# Patient Record
Sex: Female | Born: 1945 | Race: White | Hispanic: No | Marital: Married | Smoking: Never smoker
Health system: Southern US, Community
[De-identification: ages and names within clinical notes are randomized; demographics above are authoritative.]

## PROBLEM LIST (undated history)

## (undated) DIAGNOSIS — K219 Gastro-esophageal reflux disease without esophagitis: Secondary | ICD-10-CM

## (undated) DIAGNOSIS — C3490 Malignant neoplasm of unspecified part of unspecified bronchus or lung: Secondary | ICD-10-CM

## (undated) DIAGNOSIS — E079 Disorder of thyroid, unspecified: Secondary | ICD-10-CM

## (undated) DIAGNOSIS — K5792 Diverticulitis of intestine, part unspecified, without perforation or abscess without bleeding: Secondary | ICD-10-CM

## (undated) DIAGNOSIS — B379 Candidiasis, unspecified: Principal | ICD-10-CM

## (undated) DIAGNOSIS — Z973 Presence of spectacles and contact lenses: Secondary | ICD-10-CM

## (undated) DIAGNOSIS — Z972 Presence of dental prosthetic device (complete) (partial): Secondary | ICD-10-CM

## (undated) DIAGNOSIS — F419 Anxiety disorder, unspecified: Secondary | ICD-10-CM

## (undated) DIAGNOSIS — C50919 Malignant neoplasm of unspecified site of unspecified female breast: Secondary | ICD-10-CM

## (undated) DIAGNOSIS — E785 Hyperlipidemia, unspecified: Secondary | ICD-10-CM

## (undated) DIAGNOSIS — Z803 Family history of malignant neoplasm of breast: Secondary | ICD-10-CM

## (undated) DIAGNOSIS — M1990 Unspecified osteoarthritis, unspecified site: Secondary | ICD-10-CM

## (undated) DIAGNOSIS — J40 Bronchitis, not specified as acute or chronic: Secondary | ICD-10-CM

## (undated) DIAGNOSIS — Z87442 Personal history of urinary calculi: Secondary | ICD-10-CM

## (undated) DIAGNOSIS — Z8042 Family history of malignant neoplasm of prostate: Secondary | ICD-10-CM

## (undated) DIAGNOSIS — K7581 Nonalcoholic steatohepatitis (NASH): Secondary | ICD-10-CM

## (undated) DIAGNOSIS — Z800 Family history of malignant neoplasm of digestive organs: Secondary | ICD-10-CM

## (undated) DIAGNOSIS — I10 Essential (primary) hypertension: Secondary | ICD-10-CM

## (undated) DIAGNOSIS — R7303 Prediabetes: Secondary | ICD-10-CM

## (undated) DIAGNOSIS — R232 Flushing: Secondary | ICD-10-CM

## (undated) DIAGNOSIS — K08109 Complete loss of teeth, unspecified cause, unspecified class: Secondary | ICD-10-CM

## (undated) DIAGNOSIS — M810 Age-related osteoporosis without current pathological fracture: Secondary | ICD-10-CM

## (undated) DIAGNOSIS — N189 Chronic kidney disease, unspecified: Secondary | ICD-10-CM

## (undated) DIAGNOSIS — K5900 Constipation, unspecified: Secondary | ICD-10-CM

## (undated) DIAGNOSIS — H269 Unspecified cataract: Secondary | ICD-10-CM

## (undated) DIAGNOSIS — T7840XA Allergy, unspecified, initial encounter: Secondary | ICD-10-CM

## (undated) HISTORY — PX: OTHER SURGICAL HISTORY: SHX169

## (undated) HISTORY — DX: Bronchitis, not specified as acute or chronic: J40

## (undated) HISTORY — DX: Disorder of thyroid, unspecified: E07.9

## (undated) HISTORY — DX: Candidiasis, unspecified: B37.9

## (undated) HISTORY — DX: Unspecified cataract: H26.9

## (undated) HISTORY — PX: LUNG REMOVAL, PARTIAL: SHX233

## (undated) HISTORY — DX: Malignant neoplasm of unspecified site of unspecified female breast: C50.919

## (undated) HISTORY — PX: COLONOSCOPY: SHX174

## (undated) HISTORY — PX: LITHOTRIPSY: SUR834

## (undated) HISTORY — DX: Chronic kidney disease, unspecified: N18.9

## (undated) HISTORY — PX: BREAST LUMPECTOMY: SHX2

## (undated) HISTORY — DX: Hyperlipidemia, unspecified: E78.5

## (undated) HISTORY — DX: Age-related osteoporosis without current pathological fracture: M81.0

## (undated) HISTORY — DX: Family history of malignant neoplasm of breast: Z80.3

## (undated) HISTORY — DX: Allergy, unspecified, initial encounter: T78.40XA

## (undated) HISTORY — DX: Family history of malignant neoplasm of digestive organs: Z80.0

## (undated) HISTORY — DX: Unspecified osteoarthritis, unspecified site: M19.90

## (undated) HISTORY — DX: Family history of malignant neoplasm of prostate: Z80.42

## (undated) HISTORY — PX: TONSILLECTOMY: SUR1361

## (undated) HISTORY — DX: Flushing: R23.2

---

## 1978-02-10 HISTORY — PX: ABDOMINAL HYSTERECTOMY: SHX81

## 1986-02-10 HISTORY — PX: THYROIDECTOMY, PARTIAL: SHX18

## 1997-05-15 ENCOUNTER — Ambulatory Visit (HOSPITAL_COMMUNITY): Admission: RE | Admit: 1997-05-15 | Discharge: 1997-05-15 | Payer: Self-pay | Admitting: Otolaryngology

## 2001-12-20 ENCOUNTER — Ambulatory Visit (HOSPITAL_BASED_OUTPATIENT_CLINIC_OR_DEPARTMENT_OTHER): Admission: RE | Admit: 2001-12-20 | Discharge: 2001-12-20 | Payer: Self-pay | Admitting: Urology

## 2007-02-11 HISTORY — PX: BREAST SURGERY: SHX581

## 2007-10-22 ENCOUNTER — Encounter: Admission: RE | Admit: 2007-10-22 | Discharge: 2007-10-22 | Payer: Self-pay | Admitting: General Surgery

## 2007-10-25 ENCOUNTER — Encounter: Admission: RE | Admit: 2007-10-25 | Discharge: 2007-10-25 | Payer: Self-pay | Admitting: General Surgery

## 2007-10-25 ENCOUNTER — Encounter (INDEPENDENT_AMBULATORY_CARE_PROVIDER_SITE_OTHER): Payer: Self-pay | Admitting: General Surgery

## 2007-10-25 ENCOUNTER — Ambulatory Visit (HOSPITAL_BASED_OUTPATIENT_CLINIC_OR_DEPARTMENT_OTHER): Admission: RE | Admit: 2007-10-25 | Discharge: 2007-10-25 | Payer: Self-pay | Admitting: General Surgery

## 2007-10-28 ENCOUNTER — Ambulatory Visit: Payer: Self-pay | Admitting: Oncology

## 2007-11-09 ENCOUNTER — Ambulatory Visit: Admission: RE | Admit: 2007-11-09 | Discharge: 2008-01-31 | Payer: Self-pay | Admitting: Radiation Oncology

## 2008-01-11 ENCOUNTER — Ambulatory Visit: Payer: Self-pay | Admitting: Oncology

## 2008-01-13 LAB — CBC WITH DIFFERENTIAL/PLATELET
BASO%: 0.5 % (ref 0.0–2.0)
Eosinophils Absolute: 0 10*3/uL (ref 0.0–0.5)
LYMPH%: 15.1 % (ref 14.0–48.0)
MCH: 32.5 pg (ref 26.0–34.0)
MCHC: 34.9 g/dL (ref 32.0–36.0)
MCV: 93.1 fL (ref 81.0–101.0)
MONO%: 8.9 % (ref 0.0–13.0)
Platelets: 190 10*3/uL (ref 145–400)
RBC: 4.4 10*6/uL (ref 3.70–5.32)

## 2008-01-13 LAB — COMPREHENSIVE METABOLIC PANEL
Alkaline Phosphatase: 67 U/L (ref 39–117)
Glucose, Bld: 88 mg/dL (ref 70–99)
Sodium: 143 mEq/L (ref 135–145)
Total Bilirubin: 0.9 mg/dL (ref 0.3–1.2)
Total Protein: 6.6 g/dL (ref 6.0–8.3)

## 2008-01-14 ENCOUNTER — Ambulatory Visit: Payer: Self-pay | Admitting: Genetic Counselor

## 2008-03-08 ENCOUNTER — Ambulatory Visit: Payer: Self-pay | Admitting: Genetic Counselor

## 2008-03-10 ENCOUNTER — Ambulatory Visit: Payer: Self-pay | Admitting: Oncology

## 2008-03-10 LAB — COMPREHENSIVE METABOLIC PANEL
ALT: 10 U/L (ref 0–35)
AST: 11 U/L (ref 0–37)
Alkaline Phosphatase: 39 U/L (ref 39–117)
Creatinine, Ser: 1.23 mg/dL — ABNORMAL HIGH (ref 0.40–1.20)
Glucose, Bld: 85 mg/dL (ref 70–99)
Sodium: 145 mEq/L (ref 135–145)
Total Bilirubin: 0.4 mg/dL (ref 0.3–1.2)
Total Protein: 6.1 g/dL (ref 6.0–8.3)

## 2008-03-10 LAB — CBC WITH DIFFERENTIAL/PLATELET
BASO%: 0.3 % (ref 0.0–2.0)
HGB: 14.1 g/dL (ref 11.6–15.9)
LYMPH%: 19.8 % (ref 14.0–48.0)
MCHC: 34.8 g/dL (ref 32.0–36.0)
MCV: 93.1 fL (ref 81.0–101.0)
MONO%: 11.2 % (ref 0.0–13.0)
Platelets: 193 10*3/uL (ref 145–400)
RBC: 4.36 10*6/uL (ref 3.70–5.32)
RDW: 12.3 % (ref 11.3–14.5)
WBC: 5.7 10*3/uL (ref 3.9–10.0)

## 2008-05-10 ENCOUNTER — Ambulatory Visit: Payer: Self-pay | Admitting: Oncology

## 2008-05-15 LAB — COMPREHENSIVE METABOLIC PANEL
ALT: 10 U/L (ref 0–35)
AST: 14 U/L (ref 0–37)
Albumin: 3.9 g/dL (ref 3.5–5.2)
Alkaline Phosphatase: 35 U/L — ABNORMAL LOW (ref 39–117)
Potassium: 4.3 mEq/L (ref 3.5–5.3)
Sodium: 140 mEq/L (ref 135–145)
Total Bilirubin: 0.6 mg/dL (ref 0.3–1.2)
Total Protein: 6.2 g/dL (ref 6.0–8.3)

## 2008-05-15 LAB — CBC WITH DIFFERENTIAL/PLATELET
BASO%: 0.2 % (ref 0.0–2.0)
EOS%: 1.1 % (ref 0.0–7.0)
LYMPH%: 20.8 % (ref 14.0–49.7)
MCHC: 34.4 g/dL (ref 31.5–36.0)
MCV: 92.5 fL (ref 79.5–101.0)
MONO%: 9.1 % (ref 0.0–14.0)
NEUT#: 4.1 10*3/uL (ref 1.5–6.5)
Platelets: 169 10*3/uL (ref 145–400)
RBC: 4.36 10*6/uL (ref 3.70–5.45)
RDW: 12.4 % (ref 11.2–14.5)
WBC: 6 10*3/uL (ref 3.9–10.3)

## 2008-08-11 ENCOUNTER — Ambulatory Visit: Payer: Self-pay | Admitting: Oncology

## 2008-08-16 LAB — COMPREHENSIVE METABOLIC PANEL
ALT: 12 U/L (ref 0–35)
Albumin: 3.6 g/dL (ref 3.5–5.2)
Alkaline Phosphatase: 39 U/L (ref 39–117)
CO2: 26 mEq/L (ref 19–32)
Glucose, Bld: 148 mg/dL — ABNORMAL HIGH (ref 70–99)
Potassium: 4.2 mEq/L (ref 3.5–5.3)
Sodium: 143 mEq/L (ref 135–145)
Total Bilirubin: 0.4 mg/dL (ref 0.3–1.2)
Total Protein: 5.6 g/dL — ABNORMAL LOW (ref 6.0–8.3)

## 2008-08-16 LAB — CBC WITH DIFFERENTIAL/PLATELET
BASO%: 0.2 % (ref 0.0–2.0)
Eosinophils Absolute: 0.1 10*3/uL (ref 0.0–0.5)
LYMPH%: 17.3 % (ref 14.0–49.7)
MCHC: 34.2 g/dL (ref 31.5–36.0)
MONO#: 0.4 10*3/uL (ref 0.1–0.9)
NEUT#: 5 10*3/uL (ref 1.5–6.5)
RBC: 4.03 10*6/uL (ref 3.70–5.45)
RDW: 13.2 % (ref 11.2–14.5)
WBC: 6.7 10*3/uL (ref 3.9–10.3)

## 2008-10-06 ENCOUNTER — Encounter: Admission: RE | Admit: 2008-10-06 | Discharge: 2008-10-06 | Payer: Self-pay | Admitting: Oncology

## 2008-11-28 ENCOUNTER — Ambulatory Visit: Payer: Self-pay | Admitting: Oncology

## 2008-12-21 LAB — CBC WITH DIFFERENTIAL/PLATELET
Basophils Absolute: 0 10*3/uL (ref 0.0–0.1)
EOS%: 1.1 % (ref 0.0–7.0)
HGB: 13.7 g/dL (ref 11.6–15.9)
LYMPH%: 23.8 % (ref 14.0–49.7)
MCH: 31.9 pg (ref 25.1–34.0)
MCV: 94.4 fL (ref 79.5–101.0)
MONO%: 8.2 % (ref 0.0–14.0)
Platelets: 170 10*3/uL (ref 145–400)
RBC: 4.31 10*6/uL (ref 3.70–5.45)
RDW: 12.3 % (ref 11.2–14.5)

## 2008-12-21 LAB — COMPREHENSIVE METABOLIC PANEL
AST: 11 U/L (ref 0–37)
Albumin: 4 g/dL (ref 3.5–5.2)
Alkaline Phosphatase: 41 U/L (ref 39–117)
BUN: 18 mg/dL (ref 6–23)
Creatinine, Ser: 0.96 mg/dL (ref 0.40–1.20)
Potassium: 4 mEq/L (ref 3.5–5.3)
Total Bilirubin: 0.6 mg/dL (ref 0.3–1.2)

## 2008-12-25 LAB — PROTEIN ELECTROPHORESIS, SERUM
Albumin ELP: 62.5 % (ref 55.8–66.1)
Alpha-1-Globulin: 5.7 % — ABNORMAL HIGH (ref 2.9–4.9)
Alpha-2-Globulin: 10.1 % (ref 7.1–11.8)
Beta 2: 5 % (ref 3.2–6.5)
Beta Globulin: 7.2 % (ref 4.7–7.2)
Gamma Globulin: 9.5 % — ABNORMAL LOW (ref 11.1–18.8)

## 2008-12-25 LAB — APTT: aPTT: 21 seconds — ABNORMAL LOW (ref 24–37)

## 2008-12-25 LAB — PROTHROMBIN TIME
INR: 0.97 (ref <1.50)
Prothrombin Time: 12.8 seconds (ref 11.6–15.2)

## 2008-12-25 LAB — KAPPA/LAMBDA LIGHT CHAINS

## 2009-03-20 ENCOUNTER — Ambulatory Visit: Payer: Self-pay | Admitting: Oncology

## 2009-03-23 LAB — COMPREHENSIVE METABOLIC PANEL
Albumin: 4 g/dL (ref 3.5–5.2)
Alkaline Phosphatase: 40 U/L (ref 39–117)
BUN: 23 mg/dL (ref 6–23)
Glucose, Bld: 112 mg/dL — ABNORMAL HIGH (ref 70–99)
Potassium: 4.3 mEq/L (ref 3.5–5.3)

## 2009-03-23 LAB — CBC WITH DIFFERENTIAL/PLATELET
Basophils Absolute: 0 10*3/uL (ref 0.0–0.1)
Eosinophils Absolute: 0.1 10*3/uL (ref 0.0–0.5)
HCT: 38.1 % (ref 34.8–46.6)
HGB: 13.1 g/dL (ref 11.6–15.9)
LYMPH%: 26.7 % (ref 14.0–49.7)
MCV: 92.5 fL (ref 79.5–101.0)
MONO%: 9.5 % (ref 0.0–14.0)
NEUT#: 3.2 10*3/uL (ref 1.5–6.5)
NEUT%: 60.7 % (ref 38.4–76.8)
Platelets: 171 10*3/uL (ref 145–400)

## 2009-07-03 ENCOUNTER — Ambulatory Visit: Payer: Self-pay | Admitting: Oncology

## 2009-07-04 ENCOUNTER — Emergency Department (HOSPITAL_COMMUNITY): Admission: EM | Admit: 2009-07-04 | Discharge: 2009-07-04 | Payer: Self-pay | Admitting: Emergency Medicine

## 2009-07-27 LAB — CBC WITH DIFFERENTIAL/PLATELET
Basophils Absolute: 0 10*3/uL (ref 0.0–0.1)
EOS%: 2 % (ref 0.0–7.0)
Eosinophils Absolute: 0.1 10*3/uL (ref 0.0–0.5)
HGB: 13.5 g/dL (ref 11.6–15.9)
LYMPH%: 22.1 % (ref 14.0–49.7)
MCH: 32 pg (ref 25.1–34.0)
MCV: 92.3 fL (ref 79.5–101.0)
MONO%: 7.1 % (ref 0.0–14.0)
NEUT#: 3.6 10*3/uL (ref 1.5–6.5)
Platelets: 156 10*3/uL (ref 145–400)
RBC: 4.22 10*6/uL (ref 3.70–5.45)

## 2009-07-27 LAB — COMPREHENSIVE METABOLIC PANEL
Alkaline Phosphatase: 42 U/L (ref 39–117)
BUN: 19 mg/dL (ref 6–23)
Glucose, Bld: 109 mg/dL — ABNORMAL HIGH (ref 70–99)
Total Bilirubin: 0.4 mg/dL (ref 0.3–1.2)

## 2009-10-24 ENCOUNTER — Ambulatory Visit: Payer: Self-pay | Admitting: Oncology

## 2009-10-29 LAB — CBC WITH DIFFERENTIAL/PLATELET
EOS%: 0 % (ref 0.0–7.0)
Eosinophils Absolute: 0 10*3/uL (ref 0.0–0.5)
LYMPH%: 31.3 % (ref 14.0–49.7)
MCH: 31.6 pg (ref 25.1–34.0)
MCHC: 34.4 g/dL (ref 31.5–36.0)
MCV: 91.7 fL (ref 79.5–101.0)
MONO%: 10.3 % (ref 0.0–14.0)
NEUT#: 3.4 10*3/uL (ref 1.5–6.5)
Platelets: 195 10*3/uL (ref 145–400)
RBC: 3.76 10*6/uL (ref 3.70–5.45)
RDW: 13.1 % (ref 11.2–14.5)

## 2009-10-29 LAB — COMPREHENSIVE METABOLIC PANEL
AST: 12 U/L (ref 0–37)
Alkaline Phosphatase: 42 U/L (ref 39–117)
Glucose, Bld: 83 mg/dL (ref 70–99)
Potassium: 4.2 mEq/L (ref 3.5–5.3)
Sodium: 142 mEq/L (ref 135–145)
Total Bilirubin: 0.3 mg/dL (ref 0.3–1.2)
Total Protein: 5.8 g/dL — ABNORMAL LOW (ref 6.0–8.3)

## 2009-11-16 ENCOUNTER — Encounter: Admission: RE | Admit: 2009-11-16 | Discharge: 2009-11-16 | Payer: Self-pay | Admitting: Oncology

## 2010-02-21 ENCOUNTER — Ambulatory Visit: Payer: Self-pay | Admitting: Oncology

## 2010-02-25 LAB — COMPREHENSIVE METABOLIC PANEL
ALT: 12 U/L (ref 0–35)
AST: 15 U/L (ref 0–37)
Albumin: 3.8 g/dL (ref 3.5–5.2)
Alkaline Phosphatase: 42 U/L (ref 39–117)
BUN: 22 mg/dL (ref 6–23)
CO2: 27 mEq/L (ref 19–32)
Calcium: 8.9 mg/dL (ref 8.4–10.5)
Chloride: 108 mEq/L (ref 96–112)
Creatinine, Ser: 0.98 mg/dL (ref 0.40–1.20)
Glucose, Bld: 88 mg/dL (ref 70–99)
Potassium: 4.3 mEq/L (ref 3.5–5.3)
Sodium: 142 mEq/L (ref 135–145)
Total Bilirubin: 0.4 mg/dL (ref 0.3–1.2)
Total Protein: 6.3 g/dL (ref 6.0–8.3)

## 2010-02-25 LAB — CBC WITH DIFFERENTIAL/PLATELET
BASO%: 0.6 % (ref 0.0–2.0)
Basophils Absolute: 0 10*3/uL (ref 0.0–0.1)
EOS%: 1.2 % (ref 0.0–7.0)
Eosinophils Absolute: 0.1 10*3/uL (ref 0.0–0.5)
HCT: 41.8 % (ref 34.8–46.6)
HGB: 14.3 g/dL (ref 11.6–15.9)
LYMPH%: 28.9 % (ref 14.0–49.7)
MCH: 31.3 pg (ref 25.1–34.0)
MCHC: 34.1 g/dL (ref 31.5–36.0)
MCV: 91.8 fL (ref 79.5–101.0)
MONO#: 0.5 10*3/uL (ref 0.1–0.9)
MONO%: 8.5 % (ref 0.0–14.0)
NEUT#: 3.8 10*3/uL (ref 1.5–6.5)
NEUT%: 60.8 % (ref 38.4–76.8)
Platelets: 186 10*3/uL (ref 145–400)
RBC: 4.56 10*6/uL (ref 3.70–5.45)
RDW: 13.5 % (ref 11.2–14.5)
WBC: 6.2 10*3/uL (ref 3.9–10.3)
lymph#: 1.8 10*3/uL (ref 0.9–3.3)

## 2010-04-29 LAB — URINALYSIS, ROUTINE W REFLEX MICROSCOPIC
Nitrite: NEGATIVE
Specific Gravity, Urine: 1.022 (ref 1.005–1.030)
Urobilinogen, UA: 0.2 mg/dL (ref 0.0–1.0)

## 2010-04-29 LAB — POCT I-STAT, CHEM 8
Calcium, Ion: 1.07 mmol/L — ABNORMAL LOW (ref 1.12–1.32)
Creatinine, Ser: 1.1 mg/dL (ref 0.4–1.2)
Glucose, Bld: 116 mg/dL — ABNORMAL HIGH (ref 70–99)
Hemoglobin: 13.6 g/dL (ref 12.0–15.0)
Sodium: 141 mEq/L (ref 135–145)
TCO2: 24 mmol/L (ref 0–100)

## 2010-04-29 LAB — URINE MICROSCOPIC-ADD ON

## 2010-06-05 ENCOUNTER — Other Ambulatory Visit: Payer: Self-pay | Admitting: Surgery

## 2010-06-05 DIAGNOSIS — M5030 Other cervical disc degeneration, unspecified cervical region: Secondary | ICD-10-CM

## 2010-06-05 DIAGNOSIS — M542 Cervicalgia: Secondary | ICD-10-CM

## 2010-06-05 DIAGNOSIS — M5412 Radiculopathy, cervical region: Secondary | ICD-10-CM

## 2010-06-06 ENCOUNTER — Ambulatory Visit
Admission: RE | Admit: 2010-06-06 | Discharge: 2010-06-06 | Disposition: A | Discharge status: Home / Self Care | Payer: Managed Care, Other (non HMO) | Source: Ambulatory Visit | Attending: Surgery | Admitting: Surgery

## 2010-06-06 DIAGNOSIS — M5412 Radiculopathy, cervical region: Secondary | ICD-10-CM

## 2010-06-06 DIAGNOSIS — M5030 Other cervical disc degeneration, unspecified cervical region: Secondary | ICD-10-CM

## 2010-06-06 DIAGNOSIS — M542 Cervicalgia: Secondary | ICD-10-CM

## 2010-06-07 ENCOUNTER — Other Ambulatory Visit: Payer: Self-pay | Admitting: Surgery

## 2010-06-07 DIAGNOSIS — M5412 Radiculopathy, cervical region: Secondary | ICD-10-CM

## 2010-06-07 DIAGNOSIS — M5030 Other cervical disc degeneration, unspecified cervical region: Secondary | ICD-10-CM

## 2010-06-24 ENCOUNTER — Other Ambulatory Visit: Payer: Self-pay | Admitting: Oncology

## 2010-06-24 ENCOUNTER — Encounter (HOSPITAL_BASED_OUTPATIENT_CLINIC_OR_DEPARTMENT_OTHER): Payer: Managed Care, Other (non HMO) | Admitting: Oncology

## 2010-06-24 DIAGNOSIS — R232 Flushing: Secondary | ICD-10-CM

## 2010-06-24 DIAGNOSIS — Z9889 Other specified postprocedural states: Secondary | ICD-10-CM

## 2010-06-24 DIAGNOSIS — D0590 Unspecified type of carcinoma in situ of unspecified breast: Secondary | ICD-10-CM

## 2010-06-24 LAB — COMPREHENSIVE METABOLIC PANEL
AST: 12 U/L (ref 0–37)
Albumin: 4.1 g/dL (ref 3.5–5.2)
Alkaline Phosphatase: 37 U/L — ABNORMAL LOW (ref 39–117)
Potassium: 4.3 mEq/L (ref 3.5–5.3)
Sodium: 143 mEq/L (ref 135–145)
Total Bilirubin: 0.5 mg/dL (ref 0.3–1.2)
Total Protein: 5.7 g/dL — ABNORMAL LOW (ref 6.0–8.3)

## 2010-06-24 LAB — CBC WITH DIFFERENTIAL/PLATELET
EOS%: 0.4 % (ref 0.0–7.0)
MCH: 32.6 pg (ref 25.1–34.0)
MCHC: 34.9 g/dL (ref 31.5–36.0)
MCV: 93.6 fL (ref 79.5–101.0)
MONO%: 8.8 % (ref 0.0–14.0)
RBC: 4.25 10*6/uL (ref 3.70–5.45)
RDW: 13.1 % (ref 11.2–14.5)

## 2010-06-25 NOTE — Op Note (Signed)
NAMEHAYDAN, Renee Wilkerson                ACCOUNT NO.:  [id number]   MEDICAL RECORD NO.:  [id number]          PATIENT TYPE:  AMB   LOCATION:  DSC                          FACILITY:  [facility]   PHYSICIAN:  Angelia Mould. Derrell Lolling, M.D.DATE OF BIRTH:  09-Aug-1945   DATE OF PROCEDURE:  10/25/2007  DATE OF DISCHARGE:                               OPERATIVE REPORT   PREOPERATIVE DIAGNOSIS:  Ductal carcinoma in situ, right breast.   POSTOPERATIVE DIAGNOSIS:  Ductal carcinoma in situ, right breast.   OPERATION PERFORMED:  Right partial mastectomy with needle localization  and specimen mammogram.   SURGEON:  Angelia Mould. Derrell Lolling, MD   OPERATIVE INDICATIONS:  This is a 65 year old white female who had  screening mammograms recently and calcifications were found.  She had  MRI, ultrasound, and further imaging studies.  What was found was a  solitary area of microcalcifications in the right breast at about the 8  o'clock position.  Core biopsy of this area showed ductal carcinoma in  situ.  She was interested in breast conservation if that was possible.  She was counseled regarding this as an outpatient.  She underwent needle  localization this morning, which showed an area of microcalcifications  that was about 2.5 cm in vertical dimension by about 3 cm wide in 8  o'clock position of the right breast.  She was brought to [facility] for excision of this area.   OPERATIVE TECHNIQUE:  The patient underwent wire localization of her  microcalcifications as described above.  I reviewed the films prior to  the surgery.  She was taken to the operating room for general  anesthesia.  The patient was identified as a correct patient and correct  procedure and correct site.  The right breast was prepped and draped in  sterile fashion.  Marcaine 0.5% with epinephrine was used as a local  infiltration anesthetic.  A radially-oriented incision was made about  the 8 o'clock position on the right breast.   Dissection included a very  thin ellipse of skin to be sure that we got widely around the  microcalcifications.  We then dissected down into the breast tissue with  electrocautery.  With counter traction got widely around this area  trying to get a 1-cm margin in all directions.  The specimen was marked  with silk sutures and also it was marked with the 6 color margin marker  kit.  Specimen mammogram was performed and this was read by the  physicians at the [facility] of [place] and they called back and  said that it looked very good and we appeared to have removed all the  microcalcifications.  The specimens were sent for routine histology.  Hemostasis was excellent and achieved with electrocautery.  The breast  was irrigated with saline.  The breast tissue was closed with  interrupted sutures of 3-0 Vicryl and the skin closed with a  running subcuticular suture of 4-0 Monocryl and Steri-Strips.  Clean  bandages were placed and the patient taken to the recovery room in  stable condition.  Estimated blood loss was  about 10-20 mL.  Complications none.  Sponge, needle, and instrument counts were correct.      Angelia Mould. Derrell Lolling, M.D.  Electronically Signed     HMI/MEDQ  D:  10/25/2007  T:  10/26/2007  Job:  [id number]   cc:   Feliciana Rossetti, MD

## 2010-06-28 ENCOUNTER — Ambulatory Visit
Admission: RE | Admit: 2010-06-28 | Discharge: 2010-06-28 | Disposition: A | Discharge status: Home / Self Care | Payer: Managed Care, Other (non HMO) | Source: Ambulatory Visit | Attending: Surgery | Admitting: Surgery

## 2010-06-28 DIAGNOSIS — M5030 Other cervical disc degeneration, unspecified cervical region: Secondary | ICD-10-CM

## 2010-06-28 DIAGNOSIS — M5412 Radiculopathy, cervical region: Secondary | ICD-10-CM

## 2010-07-03 ENCOUNTER — Encounter (INDEPENDENT_AMBULATORY_CARE_PROVIDER_SITE_OTHER): Payer: Self-pay | Admitting: General Surgery

## 2010-11-13 LAB — COMPREHENSIVE METABOLIC PANEL
ALT: 14
Albumin: 4.1
Alkaline Phosphatase: 55
Chloride: 106
Glucose, Bld: 91
Potassium: 4.6
Sodium: 140
Total Protein: 6.4

## 2010-11-13 LAB — URINALYSIS, ROUTINE W REFLEX MICROSCOPIC
Bilirubin Urine: NEGATIVE
Glucose, UA: NEGATIVE
Hgb urine dipstick: NEGATIVE
Ketones, ur: NEGATIVE
Protein, ur: NEGATIVE

## 2010-11-13 LAB — DIFFERENTIAL
Basophils Relative: 1
Eosinophils Absolute: 0
Monocytes Absolute: 0.5
Monocytes Relative: 10
Neutrophils Relative %: 54

## 2010-11-13 LAB — CBC
HCT: 42.8
MCV: 93.4
Platelets: 226
WBC: 5.4

## 2010-11-13 LAB — POCT HEMOGLOBIN-HEMACUE: Hemoglobin: 13.8

## 2010-11-19 ENCOUNTER — Other Ambulatory Visit: Payer: Self-pay | Admitting: Oncology

## 2010-11-19 DIAGNOSIS — Z853 Personal history of malignant neoplasm of breast: Secondary | ICD-10-CM

## 2010-11-19 DIAGNOSIS — Z9889 Other specified postprocedural states: Secondary | ICD-10-CM

## 2010-11-21 ENCOUNTER — Ambulatory Visit
Admission: RE | Admit: 2010-11-21 | Discharge: 2010-11-21 | Disposition: A | Discharge status: Home / Self Care | Payer: Commercial Indemnity | Source: Ambulatory Visit | Attending: Oncology | Admitting: Oncology

## 2010-11-21 DIAGNOSIS — Z9889 Other specified postprocedural states: Secondary | ICD-10-CM

## 2010-11-25 ENCOUNTER — Other Ambulatory Visit: Payer: Self-pay | Admitting: Oncology

## 2010-11-25 ENCOUNTER — Encounter (HOSPITAL_BASED_OUTPATIENT_CLINIC_OR_DEPARTMENT_OTHER): Payer: Commercial Indemnity | Admitting: Oncology

## 2010-11-25 DIAGNOSIS — Z23 Encounter for immunization: Secondary | ICD-10-CM

## 2010-11-25 DIAGNOSIS — D0590 Unspecified type of carcinoma in situ of unspecified breast: Secondary | ICD-10-CM

## 2010-11-25 LAB — CBC WITH DIFFERENTIAL/PLATELET
BASO%: 0.3 % (ref 0.0–2.0)
Basophils Absolute: 0 10*3/uL (ref 0.0–0.1)
HCT: 38.2 % (ref 34.8–46.6)
HGB: 13.1 g/dL (ref 11.6–15.9)
MCHC: 34.2 g/dL (ref 31.5–36.0)
MONO#: 0.5 10*3/uL (ref 0.1–0.9)
NEUT%: 64.1 % (ref 38.4–76.8)
WBC: 6.5 10*3/uL (ref 3.9–10.3)
lymph#: 1.7 10*3/uL (ref 0.9–3.3)

## 2010-11-25 LAB — COMPREHENSIVE METABOLIC PANEL
ALT: 10 U/L (ref 0–35)
Albumin: 3.9 g/dL (ref 3.5–5.2)
CO2: 25 mEq/L (ref 19–32)
Calcium: 9.1 mg/dL (ref 8.4–10.5)
Chloride: 109 mEq/L (ref 96–112)
Creatinine, Ser: 0.98 mg/dL (ref 0.50–1.10)

## 2010-11-27 ENCOUNTER — Ambulatory Visit
Admission: RE | Admit: 2010-11-27 | Discharge: 2010-11-27 | Disposition: A | Discharge status: Home / Self Care | Payer: Commercial Indemnity | Source: Ambulatory Visit | Attending: Oncology | Admitting: Oncology

## 2010-11-27 DIAGNOSIS — Z853 Personal history of malignant neoplasm of breast: Secondary | ICD-10-CM

## 2010-11-27 DIAGNOSIS — Z9889 Other specified postprocedural states: Secondary | ICD-10-CM

## 2010-11-27 MED ORDER — GADOBENATE DIMEGLUMINE 529 MG/ML IV SOLN
13.0000 mL | Freq: Once | INTRAVENOUS | Status: AC | PRN
  Administered 2010-11-27: 13 mL via INTRAVENOUS

## 2011-01-18 ENCOUNTER — Telehealth: Payer: Self-pay | Admitting: Oncology

## 2011-01-18 NOTE — Telephone Encounter (Signed)
lmonvm advising the pt of her march 2013 appts

## 2011-01-23 ENCOUNTER — Encounter (INDEPENDENT_AMBULATORY_CARE_PROVIDER_SITE_OTHER): Payer: Self-pay | Admitting: General Surgery

## 2011-03-21 ENCOUNTER — Telehealth: Payer: Self-pay | Admitting: Oncology

## 2011-03-21 NOTE — Telephone Encounter (Signed)
per Dr. Ha called pt and r/s appt on 03/11 to 03/27. asked pt to rtn call to confirm appt °

## 2011-03-24 ENCOUNTER — Telehealth: Payer: Self-pay | Admitting: Oncology

## 2011-03-24 NOTE — Telephone Encounter (Signed)
pt rtn call and r/s appt to 03/25

## 2011-04-21 ENCOUNTER — Other Ambulatory Visit: Payer: Commercial Indemnity | Admitting: Lab

## 2011-04-21 ENCOUNTER — Ambulatory Visit: Payer: Commercial Indemnity | Admitting: Oncology

## 2011-05-02 ENCOUNTER — Telehealth: Payer: Self-pay | Admitting: Oncology

## 2011-05-02 NOTE — Telephone Encounter (Signed)
pt called and r/s appt on 03/25 to 04/01

## 2011-05-04 ENCOUNTER — Encounter: Payer: Self-pay | Admitting: Oncology

## 2011-05-04 DIAGNOSIS — R232 Flushing: Secondary | ICD-10-CM | POA: Insufficient documentation

## 2011-05-05 ENCOUNTER — Ambulatory Visit: Payer: Commercial Indemnity | Admitting: Oncology

## 2011-05-05 ENCOUNTER — Other Ambulatory Visit: Payer: Commercial Indemnity | Admitting: Lab

## 2011-05-07 ENCOUNTER — Other Ambulatory Visit: Payer: Commercial Indemnity | Admitting: Lab

## 2011-05-07 ENCOUNTER — Ambulatory Visit: Payer: Commercial Indemnity | Admitting: Oncology

## 2011-05-12 ENCOUNTER — Telehealth: Payer: Self-pay | Admitting: Oncology

## 2011-05-12 ENCOUNTER — Other Ambulatory Visit (HOSPITAL_BASED_OUTPATIENT_CLINIC_OR_DEPARTMENT_OTHER): Payer: Commercial Indemnity | Admitting: Lab

## 2011-05-12 ENCOUNTER — Ambulatory Visit (HOSPITAL_BASED_OUTPATIENT_CLINIC_OR_DEPARTMENT_OTHER): Payer: Commercial Indemnity | Admitting: Oncology

## 2011-05-12 VITALS — BP 150/84 | HR 66 | Temp 98.1°F | Ht 62.0 in | Wt 136.4 lb

## 2011-05-12 DIAGNOSIS — R232 Flushing: Secondary | ICD-10-CM

## 2011-05-12 DIAGNOSIS — C50919 Malignant neoplasm of unspecified site of unspecified female breast: Secondary | ICD-10-CM

## 2011-05-12 DIAGNOSIS — R002 Palpitations: Secondary | ICD-10-CM

## 2011-05-12 DIAGNOSIS — D0590 Unspecified type of carcinoma in situ of unspecified breast: Secondary | ICD-10-CM

## 2011-05-12 DIAGNOSIS — N951 Menopausal and female climacteric states: Secondary | ICD-10-CM

## 2011-05-12 LAB — COMPREHENSIVE METABOLIC PANEL
ALT: 10 U/L (ref 0–35)
AST: 11 U/L (ref 0–37)
Albumin: 4 g/dL (ref 3.5–5.2)
Alkaline Phosphatase: 49 U/L (ref 39–117)
Calcium: 9.3 mg/dL (ref 8.4–10.5)
Chloride: 110 mEq/L (ref 96–112)
Potassium: 4.2 mEq/L (ref 3.5–5.3)
Sodium: 144 mEq/L (ref 135–145)
Total Protein: 5.7 g/dL — ABNORMAL LOW (ref 6.0–8.3)

## 2011-05-12 LAB — CBC WITH DIFFERENTIAL/PLATELET
BASO%: 0.4 % (ref 0.0–2.0)
HCT: 39.7 % (ref 34.8–46.6)
MCHC: 33.4 g/dL (ref 31.5–36.0)
MONO#: 0.6 10*3/uL (ref 0.1–0.9)
NEUT%: 51.4 % (ref 38.4–76.8)
RBC: 4.18 10*6/uL (ref 3.70–5.45)
RDW: 12.6 % (ref 11.2–14.5)
WBC: 5.9 10*3/uL (ref 3.9–10.3)
lymph#: 2.1 10*3/uL (ref 0.9–3.3)
nRBC: 0 % (ref 0–0)

## 2011-05-12 NOTE — Telephone Encounter (Signed)
Gv pt appt for oct2013 °

## 2011-05-12 NOTE — Progress Notes (Signed)
[facility] OFFICE PROGRESS NOTE  DIAGNOSIS:   History of 1.5 cm, grade 3 DCIS.  PAST THERAPY:  Partial mastectomy on October 25, 2007.  Adjuvant radiation therapy finished on January 17, 2008.    CURRENT THERAPY:  She started on adjuvant hormonal therapy, tamoxifen 200 mg p.o. q. day in December 2009.  INTERVAL HISTORY: Renee Wilkerson 66 y.o. female returns for regular follow up with her husband.  She reports feeling relatively well.  She recently developed some chest palpitation and is undergoing Holter's monitoring with Dr. Belva Crome, a cardiologist in [place]. She still has hot flash and night sweat.  She is taking Tamoxifen without other side effects.  She denies breast abnormality.  She is adherent to self breast exam.  She denies bone/muscle pain.  She denies leg/calf swelling or pain.  She has not had vaginal bleeding or discharge.  She is working almost full time at a [employer].  She does have mild fatigue coming home from work but she is independent of all activities of daily living.   Patient denies headache, visual changes, confusion, drenching night sweats, palpable lymph node swelling, mucositis, odynophagia, dysphagia, nausea vomiting, jaundice, chest pain, shortness of breath, dyspnea on exertion, productive cough, gum bleeding, epistaxis, hematemesis, hemoptysis, abdominal pain, abdominal swelling, early satiety, melena, hematochezia, hematuria, skin rash, spontaneous bleeding, joint swelling, joint pain, heat or cold intolerance, bowel bladder incontinence, back pain, focal motor weakness, paresthesia, depression, suicidal or homocidal ideation, feeling hopelessness.   Past Medical History  Diagnosis Date  . Breast cancer   . Thyroid disease   . Arthritis   . Hot flashes     Past Surgical History  Procedure Date  . Breast surgery 2009    BREAST    Current Outpatient Prescriptions  Medication Sig Dispense Refill  . omeprazole  (PRILOSEC) 20 MG capsule Take 20 mg by mouth daily.      Marland Kitchen TAMOXIFEN CITRATE PO Take by mouth.        . Zolpidem Tartrate (AMBIEN PO) Take by mouth.          ALLERGIES:  is allergic to sulfa antibiotics.  REVIEW OF SYSTEMS:  The rest of the 14-point review of system was negative.   Filed Vitals:   05/12/11 1502  BP: 150/84  Pulse: 66  Temp: 98.1 F (36.7 C)   Wt Readings from Last 3 Encounters:  05/12/11 136 lb 6.4 oz (61.871 kg)   ECOG Performance status: 0  PHYSICAL EXAMINATION:   General:  well-nourished in no acute distress.  Eyes:  no scleral icterus.  ENT:  There were no oropharyngeal lesions.  Neck was without thyromegaly.  Lymphatics:  Negative cervical, supraclavicular or axillary adenopathy.  Respiratory: lungs were clear bilaterally without wheezing or crackles.  Cardiovascular:  Regular rate and rhythm, S1/S2, without murmur, rub or gallop.  There was no pedal edema.  GI:  abdomen was soft, flat, nontender, nondistended, without organomegaly.  Muscoloskeletal:  no spinal tenderness of palpation of vertebral spine.  Skin exam was without echymosis, petichae.  Neuro exam was nonfocal.  Patient was able to get on and off exam table without assistance.  Gait was normal.  Patient was alerted and oriented.  Attention was good.   Language was appropriate.  Mood was normal without depression.  Speech was not pressured.  Thought content was not tangential.  Bilateral breast exam was negative for abnormal mass, skin thickening, erythema, or pain on palpation.  LABORATORY/RADIOLOGY DATA:  Lab Results  Component Value Date   WBC 5.9 05/12/2011   HGB 13.3 05/12/2011   HCT 39.7 05/12/2011   PLT 171 05/12/2011   GLUCOSE 86 05/12/2011   ALKPHOS 49 05/12/2011   ALT 10 05/12/2011   AST 11 05/12/2011   NA 144 05/12/2011   K 4.2 05/12/2011   CL 110 05/12/2011   CREATININE 0.90 05/12/2011   BUN 18 05/12/2011   CO2 26 05/12/2011   INR 0.97 12/21/2008     ASSESSMENT AND PLAN:   1. History of  DCIS.  No evidence of recurrence or metastatic disease.  Her last breast MRI in Oct 2012 was negative. Her next routine diagnostic breast mammogram is due in Oct 2013.   She has no major side effects of Tamoxifen except for hot flash.  I advised her to continue with tamoxifen until December 2014.  She asked me to refill her Tamoxifen today.  2. Hot flashes and night sweats.  She does not want meds to suppress this symptom.  3. Palpitation:  On Holter's per Dr. Tomie China.  I discussed with patient and her husband that cardiac arrythmia is not a common side effect of Tamoxifen.  4. Age-appropriate cancer screening:  Her colonoscopy was done by Dr. Chales Abrahams from [place] which was negative in 2012 and she said the next time it is due is 2017.  She thinks that her Pap smear is up to date.     The length of time of the face-to-face encounter was 15 minutes. More than 50% of time was spent counseling and coordination of care.

## 2011-10-08 ENCOUNTER — Other Ambulatory Visit: Payer: Self-pay | Admitting: Oncology

## 2011-11-11 ENCOUNTER — Ambulatory Visit (HOSPITAL_BASED_OUTPATIENT_CLINIC_OR_DEPARTMENT_OTHER): Payer: Commercial Indemnity | Admitting: Oncology

## 2011-11-11 ENCOUNTER — Other Ambulatory Visit (HOSPITAL_BASED_OUTPATIENT_CLINIC_OR_DEPARTMENT_OTHER): Payer: Commercial Indemnity | Admitting: Lab

## 2011-11-11 ENCOUNTER — Encounter: Payer: Self-pay | Admitting: Oncology

## 2011-11-11 ENCOUNTER — Telehealth: Payer: Self-pay | Admitting: Oncology

## 2011-11-11 VITALS — BP 145/78 | HR 72 | Temp 98.0°F | Resp 20 | Ht 62.0 in | Wt 132.5 lb

## 2011-11-11 DIAGNOSIS — D0590 Unspecified type of carcinoma in situ of unspecified breast: Secondary | ICD-10-CM

## 2011-11-11 DIAGNOSIS — C50919 Malignant neoplasm of unspecified site of unspecified female breast: Secondary | ICD-10-CM

## 2011-11-11 DIAGNOSIS — D0510 Intraductal carcinoma in situ of unspecified breast: Secondary | ICD-10-CM | POA: Insufficient documentation

## 2011-11-11 DIAGNOSIS — Z23 Encounter for immunization: Secondary | ICD-10-CM

## 2011-11-11 DIAGNOSIS — R232 Flushing: Secondary | ICD-10-CM

## 2011-11-11 LAB — CBC WITH DIFFERENTIAL/PLATELET
BASO%: 0.3 % (ref 0.0–2.0)
EOS%: 0.9 % (ref 0.0–7.0)
MCH: 31.3 pg (ref 25.1–34.0)
MCHC: 33.3 g/dL (ref 31.5–36.0)
MCV: 93.8 fL (ref 79.5–101.0)
MONO%: 7 % (ref 0.0–14.0)
NEUT%: 60.1 % (ref 38.4–76.8)
RDW: 12.5 % (ref 11.2–14.5)
lymph#: 2 10*3/uL (ref 0.9–3.3)

## 2011-11-11 MED ORDER — INFLUENZA VIRUS VACC SPLIT PF IM SUSP
0.5000 mL | Freq: Once | INTRAMUSCULAR | Status: AC
  Administered 2011-11-11: 0.5 mL via INTRAMUSCULAR
  Filled 2011-11-11: qty 0.5

## 2011-11-11 MED ORDER — TAMOXIFEN CITRATE 20 MG PO TABS: 20.0000 mg | ORAL_TABLET | Freq: Every day | ORAL | Status: DC

## 2011-11-11 NOTE — Telephone Encounter (Signed)
gv and printed appt for pt. °

## 2011-11-11 NOTE — Progress Notes (Signed)
[facility] OFFICE PROGRESS NOTE  DIAGNOSIS:   History of 1.5 cm, grade 3 DCIS.  PAST THERAPY:  Partial mastectomy on October 25, 2007.  Adjuvant radiation therapy finished on January 17, 2008.    CURRENT THERAPY:  She started on adjuvant hormonal therapy, tamoxifen 200 mg p.o. q. day in December 2009.  INTERVAL HISTORY: Renee Wilkerson 66 y.o. female returns for regular follow up with her husband.  She reports feeling relatively well.  She still has hot flashes and night sweats.  She is taking Tamoxifen without other side effects.  She denies breast abnormality.  She is adherent to self breast exam.  She denies bone/muscle pain.  She denies leg/calf swelling or pain.  She has not had vaginal bleeding or discharge.  She is working almost full time at a [employer].  She does have mild fatigue coming home from work but she is independent of all activities of daily living.   Patient denies headache, visual changes, confusion, drenching night sweats, palpable lymph node swelling, mucositis, odynophagia, dysphagia, nausea vomiting, jaundice, chest pain, shortness of breath, dyspnea on exertion, productive cough, gum bleeding, epistaxis, hematemesis, hemoptysis, abdominal pain, abdominal swelling, early satiety, melena, hematochezia, hematuria, skin rash, spontaneous bleeding, joint swelling, joint pain, heat or cold intolerance, bowel bladder incontinence, back pain, focal motor weakness, paresthesia, depression, suicidal or homocidal ideation, feeling hopelessness.   Past Medical History  Diagnosis Date  . Breast cancer   . Thyroid disease   . Arthritis   . Hot flashes     Past Surgical History  Procedure Date  . Breast surgery 2009    BREAST    Current Outpatient Prescriptions  Medication Sig Dispense Refill  . omeprazole (PRILOSEC) 20 MG capsule Take 20 mg by mouth daily.      . tamoxifen (NOLVADEX) 20 MG tablet Take 1 tablet (20 mg total) by mouth daily.  90  tablet  2  . TAMOXIFEN CITRATE PO Take by mouth.        . Zolpidem Tartrate (AMBIEN PO) Take by mouth.         Current Facility-Administered Medications  Medication Dose Route Frequency Provider Last Rate Last Dose  . influenza  inactive virus vaccine (FLUZONE/FLUARIX) injection 0.5 mL  0.5 mL Intramuscular Once Myrtis Ser, NP   0.5 mL at 11/11/11 1604    ALLERGIES:  is allergic to sulfa antibiotics.  REVIEW OF SYSTEMS:  The rest of the 14-point review of system was negative.   Filed Vitals:   11/11/11 1521  BP: 145/78  Pulse: 72  Temp: 98 F (36.7 C)  Resp: 20   Wt Readings from Last 3 Encounters:  11/11/11 132 lb 8 oz (60.102 kg)  05/12/11 136 lb 6.4 oz (61.871 kg)   ECOG Performance status: 0  PHYSICAL EXAMINATION:   General:  well-nourished in no acute distress.  Eyes:  no scleral icterus.  ENT:  There were no oropharyngeal lesions.  Neck was without thyromegaly.  Lymphatics:  Negative cervical, supraclavicular or axillary adenopathy.  Respiratory: lungs were clear bilaterally without wheezing or crackles.  Cardiovascular:  Regular rate and rhythm, S1/S2, without murmur, rub or gallop.  There was no pedal edema.  GI:  abdomen was soft, flat, nontender, nondistended, without organomegaly.  Muscoloskeletal:  no spinal tenderness of palpation of vertebral spine.  Skin exam was without echymosis, petichae.  Neuro exam was nonfocal.  Patient was able to get on and off exam table without assistance.  Gait  was normal.  Patient was alerted and oriented.  Attention was good.   Language was appropriate.  Mood was normal without depression.  Speech was not pressured.  Thought content was not tangential.  Bilateral breast exam was negative for abnormal mass, skin thickening, erythema, or pain on palpation.    LABORATORY/RADIOLOGY DATA:  Lab Results  Component Value Date   WBC 6.4 11/11/2011   HGB 13.2 11/11/2011   HCT 39.6 11/11/2011   PLT 153 11/11/2011   GLUCOSE 86 05/12/2011    ALKPHOS 49 05/12/2011   ALT 10 05/12/2011   AST 11 05/12/2011   NA 144 05/12/2011   K 4.2 05/12/2011   CL 110 05/12/2011   CREATININE 0.90 05/12/2011   BUN 18 05/12/2011   CO2 26 05/12/2011   INR 0.97 12/21/2008     ASSESSMENT AND PLAN:   1. History of DCIS.  No evidence of recurrence or metastatic disease. Her next routine diagnostic breast mammogram is due in Oct 2013 which I have scheduled today.   She has no major side effects of Tamoxifen except for hot flash.  I advised her to continue with tamoxifen until December 2014.  I have refilled her Tamoxifen today.  2. Hot flashes and night sweats.  She does not want meds to suppress this symptom.  3. Palpitation: She has had an extensive cardiac work-up in the past and no cause was identified. I discussed with patient and her husband that cardiac arrythmia is not a common side effect of Tamoxifen.  4. Age-appropriate cancer screening:  Her colonoscopy was done by Dr. Chales Abrahams from [place] which was negative in 2012 and she said the next time it is due is 2017.  She states her Pap smear is up to date. Her flu shot was given in the office today. She inquired about the shingles vaccine and I explained that there is no contraindication to receiving this vaccine from our standpoint. She will follow-up with her PCP to obtain this vaccine. 5. Follow-up: 6 months.    The length of time of the face-to-face encounter was 15 minutes. More than 50% of time was spent counseling and coordination of care.

## 2011-11-11 NOTE — Patient Instructions (Addendum)
Schedule your mammogram for October 2013.  We will see you back in 6 months for a routine visit. Continue Tamoxifen.

## 2011-11-27 ENCOUNTER — Ambulatory Visit
Admission: RE | Admit: 2011-11-27 | Discharge: 2011-11-27 | Disposition: A | Discharge status: Home / Self Care | Payer: Commercial Indemnity | Source: Ambulatory Visit | Attending: Oncology | Admitting: Oncology

## 2011-11-27 DIAGNOSIS — D0510 Intraductal carcinoma in situ of unspecified breast: Secondary | ICD-10-CM

## 2011-11-28 ENCOUNTER — Telehealth: Payer: Self-pay

## 2011-11-28 ENCOUNTER — Other Ambulatory Visit: Payer: Self-pay

## 2011-11-28 NOTE — Telephone Encounter (Signed)
Message copied by Kallie Locks on Fri Nov 28, 2011 11:11 AM ------      Message from: Clenton Pare R      Created: Thu Nov 27, 2011  7:43 PM       Please call patient and let her know her mammogram did not show any evidence of cancer. F/U mammogram in 1 year.

## 2012-05-12 ENCOUNTER — Other Ambulatory Visit: Payer: Self-pay | Admitting: Oncology

## 2012-05-12 ENCOUNTER — Ambulatory Visit (HOSPITAL_BASED_OUTPATIENT_CLINIC_OR_DEPARTMENT_OTHER): Payer: Medicare Other | Admitting: Oncology

## 2012-05-12 ENCOUNTER — Other Ambulatory Visit (HOSPITAL_BASED_OUTPATIENT_CLINIC_OR_DEPARTMENT_OTHER): Payer: Medicare Other | Admitting: Lab

## 2012-05-12 ENCOUNTER — Telehealth: Payer: Self-pay | Admitting: Oncology

## 2012-05-12 VITALS — BP 170/88 | HR 68 | Temp 97.6°F | Resp 18 | Ht 62.0 in | Wt 134.2 lb

## 2012-05-12 DIAGNOSIS — D0590 Unspecified type of carcinoma in situ of unspecified breast: Secondary | ICD-10-CM

## 2012-05-12 DIAGNOSIS — D0511 Intraductal carcinoma in situ of right breast: Secondary | ICD-10-CM

## 2012-05-12 DIAGNOSIS — D0510 Intraductal carcinoma in situ of unspecified breast: Secondary | ICD-10-CM

## 2012-05-12 LAB — CBC WITH DIFFERENTIAL/PLATELET
BASO%: 1 % (ref 0.0–2.0)
Eosinophils Absolute: 0.1 10*3/uL (ref 0.0–0.5)
LYMPH%: 46.2 % (ref 14.0–49.7)
MCHC: 33.1 g/dL (ref 31.5–36.0)
MCV: 93.3 fL (ref 79.5–101.0)
MONO%: 10.6 % (ref 0.0–14.0)
NEUT#: 2 10*3/uL (ref 1.5–6.5)
Platelets: 157 10*3/uL (ref 145–400)
RBC: 4.11 10*6/uL (ref 3.70–5.45)
RDW: 13.3 % (ref 11.2–14.5)
WBC: 5 10*3/uL (ref 3.9–10.3)

## 2012-05-12 LAB — COMPREHENSIVE METABOLIC PANEL (CC13)
ALT: 7 U/L (ref 0–55)
AST: 12 U/L (ref 5–34)
Albumin: 3.4 g/dL — ABNORMAL LOW (ref 3.5–5.0)
Alkaline Phosphatase: 55 U/L (ref 40–150)
Glucose: 83 mg/dl (ref 70–99)
Potassium: 4 mEq/L (ref 3.5–5.1)
Sodium: 143 mEq/L (ref 136–145)
Total Bilirubin: 0.33 mg/dL (ref 0.20–1.20)
Total Protein: 5.9 g/dL — ABNORMAL LOW (ref 6.4–8.3)

## 2012-05-12 NOTE — Telephone Encounter (Signed)
, °

## 2012-05-12 NOTE — Progress Notes (Signed)
[facility] OFFICE PROGRESS NOTE  DIAGNOSIS:   History of 1.5 cm, grade 3 DCIS.  PAST THERAPY:  Partial mastectomy on October 25, 2007.  Adjuvant radiation therapy finished on January 17, 2008.    CURRENT THERAPY:  She started on adjuvant hormonal therapy, tamoxifen 200 mg p.o. q. day in December 2009.  INTERVAL HISTORY: Renee Wilkerson 67 y.o. female returns for regular follow up with her husband.  She developed right breast discomfort about 2 weeks ago.  It appeared red and inflamed.  There was no trauma, opened wound.  It was near the site of right lumpectomy.  It was tender to the point of her not being able to touch it.  It now is not as tender; however, she can still feel some thickening in the area.  She still has mild hot flash.  She is getting used to it.  She denied fever, headache, SOB, chest pain, abdominal pain, bleeding symptoms; calf/thigh swelling/pain, vaginal bleeding.  She is working part time at a [employer].  It is going well for her. She reports bilateral hip pain, worst in the morning; but also worsened being on her feet working.  Pain is mild to moderate; radiating to bilateral lateral thigh; feeling like shooting pain sometime.  She denied leg weakness, paresthesia.  The rest of the 14-point review of system was negative.    Past Medical History  Diagnosis Date  . Breast cancer   . Thyroid disease   . Arthritis   . Hot flashes     Past Surgical History  Procedure Laterality Date  . Breast surgery  2009    BREAST    Current Outpatient Prescriptions  Medication Sig Dispense Refill  . omeprazole (PRILOSEC) 20 MG capsule Take 20 mg by mouth daily.      . tamoxifen (NOLVADEX) 20 MG tablet Take 1 tablet (20 mg total) by mouth daily.  90 tablet  2  . TAMOXIFEN CITRATE PO Take by mouth.        . Zolpidem Tartrate (AMBIEN PO) Take by mouth.         No current facility-administered medications for this visit.    ALLERGIES:  is allergic to  sulfa antibiotics.  REVIEW OF SYSTEMS:  The rest of the 14-point review of system was negative.   Filed Vitals:   05/12/12 1428  BP: 170/88  Pulse: 68  Temp: 97.6 F (36.4 C)  Resp: 18   Wt Readings from Last 3 Encounters:  05/12/12 134 lb 3.2 oz (60.873 kg)  11/11/11 132 lb 8 oz (60.102 kg)  05/12/11 136 lb 6.4 oz (61.871 kg)   ECOG Performance status: 0  PHYSICAL EXAMINATION:   General:  well-nourished in no acute distress.  Eyes:  no scleral icterus.  ENT:  There were no oropharyngeal lesions.  Neck was without thyromegaly.  Lymphatics:  Negative cervical, supraclavicular or axillary adenopathy.  Respiratory: lungs were clear bilaterally without wheezing or crackles.  Cardiovascular:  Regular rate and rhythm, S1/S2, without murmur, rub or gallop.  There was no pedal edema.  GI:  abdomen was soft, flat, nontender, nondistended, without organomegaly.  Muscoloskeletal:  no spinal tenderness of palpation of vertebral spine.  Skin exam was without echymosis, petichae.  Neuro exam was nonfocal.  Patient was able to get on and off exam table without assistance.  Gait was normal.  Patient was alerted and oriented.  Attention was good.   Language was appropriate.  Mood was normal without depression.  Speech was not pressured.  Thought content was not tangential.  Bilateral breast exam showed left outer lower quadrant lumpectomy scar, with mild tenderness to palpation. There was no erythema, purulent discharge, skin thickening.         LABORATORY/RADIOLOGY DATA:  Lab Results  Component Value Date   WBC 5.0 05/12/2012   HGB 12.7 05/12/2012   HCT 38.4 05/12/2012   PLT 157 05/12/2012   GLUCOSE 83 05/12/2012   ALKPHOS 55 05/12/2012   ALT 7 05/12/2012   AST 12 05/12/2012   NA 143 05/12/2012   K 4.0 05/12/2012   CL 111* 05/12/2012   CREATININE 0.8 05/12/2012   BUN 14.4 05/12/2012   CO2 26 05/12/2012   INR 0.97 12/21/2008     ASSESSMENT AND PLAN:   1. History of DCIS.  I referred her to have right breast  mammogram given her symptoms.  I advised her to continue Tamoxifen until November 2014 when she will have completed 5 years of adjuvant Tamoxifen.  There is no strong indication to prolong it to more than 5 years.  There is data for longer duration of Tamoxifen in invasive breast cancer but not DCIS.  Given her hot flash, she would like to stop at that time as well.  2. Hot flashes and night sweats. Stable. No med needed.  3. Palpitation:  Negative work up with cardiology.  It has resolved.  4. Age-appropriate cancer screening:  Her colonoscopy was done by Dr. Chales Abrahams from [place] which was negative in 2012 and she said the next time it is due is 2017.  She thinks that her Pap smear is up to date.  5. Bilateral hip pain:  Most likely DJD, OA.  I advised her to see her PCP for advice.  If pain significantly worsens, we may consider bone scan.  However, with only DCIS, the chance of bone met is very low.  6. Follow up:  In about 6 months with mid level but sooner if mammogram is abnormal.   I said goodbye to [name].  I am leaving the practice in a few months, and the practice will arrange for her to find another oncologist her in the practice. It has been an honor taking care of [name] these past 5 years.     The length of time of the face-to-face encounter was 15 minutes. More than 50% of time was spent counseling and coordination of care.

## 2012-05-24 ENCOUNTER — Other Ambulatory Visit: Payer: Self-pay | Admitting: Oncology

## 2012-05-24 ENCOUNTER — Ambulatory Visit
Admission: RE | Admit: 2012-05-24 | Discharge: 2012-05-24 | Disposition: A | Discharge status: Home / Self Care | Payer: Medicare Other | Source: Ambulatory Visit | Attending: Oncology | Admitting: Oncology

## 2012-05-24 DIAGNOSIS — D0511 Intraductal carcinoma in situ of right breast: Secondary | ICD-10-CM

## 2012-05-24 DIAGNOSIS — N644 Mastodynia: Secondary | ICD-10-CM

## 2012-10-19 ENCOUNTER — Other Ambulatory Visit: Payer: Self-pay | Admitting: Internal Medicine

## 2012-10-19 ENCOUNTER — Other Ambulatory Visit: Payer: Self-pay | Admitting: Lab

## 2012-10-19 DIAGNOSIS — Z853 Personal history of malignant neoplasm of breast: Secondary | ICD-10-CM

## 2012-11-15 ENCOUNTER — Other Ambulatory Visit: Payer: Self-pay | Admitting: Oncology

## 2012-11-22 ENCOUNTER — Other Ambulatory Visit: Payer: Self-pay | Admitting: *Deleted

## 2012-11-22 DIAGNOSIS — D0511 Intraductal carcinoma in situ of right breast: Secondary | ICD-10-CM

## 2012-11-22 MED ORDER — TAMOXIFEN CITRATE 20 MG PO TABS: 20.0000 mg | ORAL_TABLET | Freq: Every day | ORAL | Status: DC

## 2012-11-25 ENCOUNTER — Other Ambulatory Visit: Payer: Self-pay | Admitting: Oncology

## 2012-11-25 DIAGNOSIS — Z853 Personal history of malignant neoplasm of breast: Secondary | ICD-10-CM

## 2012-11-26 ENCOUNTER — Telehealth: Payer: Self-pay | Admitting: Hematology and Oncology

## 2012-11-26 NOTE — Telephone Encounter (Signed)
pt switched to Dr. Bertis Ruddy per MD request

## 2012-11-29 ENCOUNTER — Ambulatory Visit
Admission: RE | Admit: 2012-11-29 | Discharge: 2012-11-29 | Disposition: A | Discharge status: Home / Self Care | Payer: Medicare Other | Source: Ambulatory Visit | Attending: Internal Medicine | Admitting: Internal Medicine

## 2012-11-29 DIAGNOSIS — Z853 Personal history of malignant neoplasm of breast: Secondary | ICD-10-CM

## 2012-12-15 ENCOUNTER — Other Ambulatory Visit: Payer: Medicare Other | Admitting: Lab

## 2012-12-15 ENCOUNTER — Ambulatory Visit: Payer: Medicare Other | Admitting: Oncology

## 2012-12-15 ENCOUNTER — Ambulatory Visit (HOSPITAL_BASED_OUTPATIENT_CLINIC_OR_DEPARTMENT_OTHER): Payer: Medicare Other | Admitting: Hematology and Oncology

## 2012-12-15 ENCOUNTER — Encounter: Payer: Self-pay | Admitting: Hematology and Oncology

## 2012-12-15 VITALS — BP 147/85 | HR 65 | Temp 97.0°F | Resp 18 | Ht 62.0 in | Wt 130.4 lb

## 2012-12-15 DIAGNOSIS — N951 Menopausal and female climacteric states: Secondary | ICD-10-CM

## 2012-12-15 DIAGNOSIS — R232 Flushing: Secondary | ICD-10-CM

## 2012-12-15 DIAGNOSIS — D0590 Unspecified type of carcinoma in situ of unspecified breast: Secondary | ICD-10-CM

## 2012-12-15 DIAGNOSIS — D0511 Intraductal carcinoma in situ of right breast: Secondary | ICD-10-CM

## 2012-12-15 NOTE — Progress Notes (Signed)
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 She received adjuvant radiation therapy until 01/17/2008 and was placed on tamoxifen since then.  INTERVAL HISTORY: Renee Wilkerson 67 y.o. female returns for further followup. She complained of right breast discomfort along the surgical site. Denies any palpable lumps. With tamoxifen, she has significant hot flashes. Apparently multiple agents were tried but he was not helpful. She also complained of chronic skeletal bone pain and takes pain medication as needed. Denies any mood swings with depression.  I have reviewed the past medical history, past surgical history, social history and family history with the patient and they are unchanged from previous note.  ALLERGIES:  is allergic to sulfa antibiotics.  MEDICATIONS:  Current Outpatient Prescriptions  Medication Sig Dispense Refill  . HYDROcodone-acetaminophen (NORCO/VICODIN) 5-325 MG per tablet Take 1 tablet by mouth every 6 (six) hours as needed for moderate pain.      Marland Kitchen omeprazole (PRILOSEC) 20 MG capsule Take 20 mg by mouth daily.      . tamoxifen (NOLVADEX) 20 MG tablet Take 1 tablet (20 mg total) by mouth daily.  30 tablet  0  . Zolpidem Tartrate (AMBIEN PO) Take by mouth.         No current facility-administered medications for this visit.    REVIEW OF SYSTEMS:   Constitutional: Denies fevers, chills or abnormal weight loss Eyes: Denies blurriness of vision Ears, nose, mouth, throat, and face: Denies mucositis or sore throat Respiratory: Denies cough, dyspnea or wheezes Cardiovascular: Denies palpitation, chest discomfort or lower  extremity swelling Gastrointestinal:  Denies nausea, heartburn or change in bowel habits Skin: Denies abnormal skin rashes Lymphatics: Denies new lymphadenopathy or easy bruising Neurological:Denies numbness, tingling or new weaknesses Behavioral/Psych: Mood is stable, no new changes  All other systems were reviewed with the patient and are negative.  PHYSICAL EXAMINATION: ECOG PERFORMANCE STATUS: 0 - Asymptomatic  Filed Vitals:   12/15/12 1500  BP: 147/85  Pulse: 65  Temp: 97 F (36.1 C)  Resp: 18   Filed Weights   12/15/12 1500  Weight: 130 lb 6.4 oz (59.149 kg)    GENERAL:alert, no distress and comfortable SKIN: skin color, texture, turgor are normal, no rashes or significant lesions EYES: normal, Conjunctiva are pink and non-injected, sclera clear OROPHARYNX:no exudate, no erythema and lips, buccal mucosa, and tongue normal  NECK: supple, thyroid normal size, non-tender, without nodularity LYMPH:  no palpable lymphadenopathy in the cervical, axillary or inguinal LUNGS: clear to auscultation and percussion with normal breathing effort HEART: regular rate & rhythm and no murmurs and no lower extremity edema ABDOMEN:abdomen soft, non-tender and normal bowel sounds Musculoskeletal:no cyanosis of digits and no clubbing  NEURO: alert & oriented x 3 with fluent speech, no focal motor/sensory deficits Bilateral breasts were examined. On the left, normal breast exam. On the right, well-healed lumpectomy scar with post radiation changes. LABORATORY DATA:  I have reviewed the data as listed    Component Value Date/Time   NA 143 05/12/2012 1413   NA 144 05/12/2011 1432   K 4.0 05/12/2012 1413   K 4.2 05/12/2011 1432   CL 111* 05/12/2012 1413   CL 110 05/12/2011 1432  CO2 26 05/12/2012 1413   CO2 26 05/12/2011 1432   GLUCOSE 83 05/12/2012 1413   GLUCOSE 86 05/12/2011 1432   BUN 14.4 05/12/2012 1413   BUN 18 05/12/2011 1432   CREATININE 0.8 05/12/2012 1413   CREATININE 0.90 05/12/2011 1432   CALCIUM  8.4 05/12/2012 1413   CALCIUM 9.3 05/12/2011 1432   PROT 5.9* 05/12/2012 1413   PROT 5.7* 05/12/2011 1432   ALBUMIN 3.4* 05/12/2012 1413   ALBUMIN 4.0 05/12/2011 1432   AST 12 05/12/2012 1413   AST 11 05/12/2011 1432   ALT 7 05/12/2012 1413   ALT 10 05/12/2011 1432   ALKPHOS 55 05/12/2012 1413   ALKPHOS 49 05/12/2011 1432   BILITOT 0.33 05/12/2012 1413   BILITOT 0.4 05/12/2011 1432   GFRNONAA >60 10/22/2007 1300   GFRAA  Value: >60        The eGFR has been calculated using the MDRD equation. This calculation has not been validated in all clinical 10/22/2007 1300    No results found for this basename: SPEP, UPEP,  kappa and lambda light chains    Lab Results  Component Value Date   WBC 5.0 05/12/2012   NEUTROABS 2.0 05/12/2012   HGB 12.7 05/12/2012   HCT 38.4 05/12/2012   MCV 93.3 05/12/2012   PLT 157 05/12/2012      Chemistry      Component Value Date/Time   NA 143 05/12/2012 1413   NA 144 05/12/2011 1432   K 4.0 05/12/2012 1413   K 4.2 05/12/2011 1432   CL 111* 05/12/2012 1413   CL 110 05/12/2011 1432   CO2 26 05/12/2012 1413   CO2 26 05/12/2011 1432   BUN 14.4 05/12/2012 1413   BUN 18 05/12/2011 1432   CREATININE 0.8 05/12/2012 1413   CREATININE 0.90 05/12/2011 1432      Component Value Date/Time   CALCIUM 8.4 05/12/2012 1413   CALCIUM 9.3 05/12/2011 1432   ALKPHOS 55 05/12/2012 1413   ALKPHOS 49 05/12/2011 1432   AST 12 05/12/2012 1413   AST 11 05/12/2011 1432   ALT 7 05/12/2012 1413   ALT 10 05/12/2011 1432   BILITOT 0.33 05/12/2012 1413   BILITOT 0.4 05/12/2011 1432     RADIOGRAPHIC STUDIES: I have personally reviewed the radiological images as listed and agreed with the findings in the report. Recent mammogram from October 2014 was normal   ASSESSMENT:  #1 DCIS #2 hot flashes  PLAN:  #1 DCIS She will complete her 5 years of adjuvant endocrine therapy next month. After that we will just perform history, physical examination, and yearly mammogram. I explained to the patient why she does not need blood work. The patient seen  angry that no blood work was ordered today #2 hot flashes The patient had tried multiple agents and is not helpful. Hopefully once she has completed her tamoxifen and come off it next month, of hot flashes will resolve.  No orders of the defined types were placed in this encounter.   All questions were answered. The patient knows to call the clinic with any problems, questions or concerns. No barriers to learning was detected. I spent 15 minutes counseling the patient face to face. The total time spent in the appointment was 20 minutes and more than 50% was on counseling and review of test results     [facility], Annalicia Renfrew, MD 12/15/2012 3:21 PM

## 2012-12-16 ENCOUNTER — Telehealth: Payer: Self-pay | Admitting: Hematology and Oncology

## 2012-12-16 NOTE — Telephone Encounter (Signed)
[facility]MM for pt adv of 12/2013 appt cal mailed shh

## 2013-09-16 ENCOUNTER — Encounter (HOSPITAL_BASED_OUTPATIENT_CLINIC_OR_DEPARTMENT_OTHER): Payer: Self-pay | Admitting: *Deleted

## 2013-09-16 NOTE — Progress Notes (Signed)
No labs needed-to bring meds

## 2013-09-21 NOTE — H&P (Signed)
[facility] [address] [place], [place] [address] [phone] A Division of [facility]  Ninetta Lights, M.D.   Robert A. Noemi Chapel, M.D.   Faythe Casa, M.D.   Johnny Bridge, M.D.   Almedia Balls, M.D Ernesta Amble. Percell Miller, M.D.  Joseph Pierini, M.D.  Lanier Prude, M.D.    Verner Chol, M.D. Mary L. Fenton Malling, PA-C  Kirstin A. Shepperson, PA-C  Josh [place], PA-C [place], [place]   RE: [place], [place]   [id number]      DOB: Jun 14, 1945 INITIAL EVALUATION: 08-23-13 Renee Wilkerson is a 68 year old female. Relatively abrupt onset of pain left shoulder 3-4 weeks ago. Getting steadily worse. Losing motion. Rest pain and night pain. More impact on activities of daily living.  No real neck pain or radicular symptoms. Everything focusing around her shoulder. No previous workup treatment or intervention on that shoulder.  Remaining history is reviewed included in the chart. Her general exam is outlined included in the chart.  EXAMINATION: Healthy 68 year old. 5'2" 130 pounds. Good cervical motion. She's neurovascularly intact both upper extremities. Left shoulder has positive impingement rotator cuff irritation. Marked stiffness. I can only get about 90 degrees of forward flexion and abduction. Internal rotation to L5 external rotation 10 degrees. Right shoulder has full motion good stability without rotator cuff irritation.  X-RAYS: 3 view x-rays show type II acromion reasonable subacromial space. Glenohumeral joint and AC joint don't look bad.  DISPOSITION: This is a picture of impingement with resultant significantly rapidly progressing adhesive capsulitis. I went over diagnosis and treatment options with her. For now we will try subacromial injection to settle down irritation then a course of physical therapy to try to improve motion function and strength. Follow-up in 4 weeks for recheck. If she Is not advancing we  have touched upon possible exam manipulation under anesthesia with arthroscopic debridement and decompression. If we proceed I will want an MRI to look at her rotator cuff ahead of time.  PROCEDURE NOTE: The patient's clinical condition is marked by substantial pain and/or significant functional disability. Other conservative therapy has not provided relief, is contraindicated, or not appropriate. There is a reasonable likelihood that injection will significantly improve the patient's pain and/or functional disability.   Patient is seated on the exam table, the  shoulder is prepped with Betadine and alcohol and injected into the subacromial interval with Depo-Medrol/Marcaine.  Perhaps a little better motion but not dramatic improvement. Pain is definitely better. Patient tolerates the procedure without difficulty.  Ninetta Lights, M.D.  Electronically verified by Ninetta Lights, M.D. DFM:kah D 08-23-13 T 08-24-13 [facility] [address] [place], [place] [address] [phone] A Division of [facility]  Ninetta Lights, M.D.   Robert A. Noemi Chapel, M.D.   Faythe Casa, M.D.   Johnny Bridge, M.D.   Almedia Balls, M.D Ernesta Amble. Percell Miller, M.D.  Joseph Pierini, M.D.  Lanier Prude, M.D.    Verner Chol, M.D. Mary L. Fenton Malling, PA-C  Kirstin A. 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  No known injury noted.  She was seen and evaluated by Dr. Kathryne Hitch on August 23, 2013 where she was given a subacromial injection to the left shoulder.  She said that this did not give her any relief of symptoms, even while Marcaine was in place.  At that point  Dr. Percell Miller recommended starting outpatient physical therapy, but insurance did not approve this up until yesterday.  She presents to our clinic today with persistent symptoms and would like to proceed with definitive treatment.  As of note, she has been working daily on a home exercise program and has not progressed at all.   Past medical, social and family history reviewed in detail on the patient questionnaire and signed.  Review of systems: As detailed in HPI.  All others reviewed and are negative.      EXAMINATION: Well-developed, well-nourished female in no acute distress.  Alert and oriented x 3.  Examination of her left shoulder reveals forward flexion and abduction to about 90 degrees.  Very limited internal and external rotation.  4/5 strength throughout.  She is neurovascularly intact distally.             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    PLAN: At this point in time we feel as though it is necessary to obtain an MRI of her left shoulder to rule out a rotator cuff tear.  She is to call after her MRI.  Today we did go ahead and fill out paperwork to proceed with left shoulder manipulation under anesthesia, as well as decompression.  The risks, benefits and possible complications of surgery were reviewed.  Rehab and recovery time discussed.  We will wait to hear from [facility] after her MRI is completed.    Ninetta Lights, M.D.   Electronically verified by Ninetta Lights, M.D. DFM(LA):jjh D 09-13-13 T 09-14-13

## 2013-09-22 ENCOUNTER — Encounter (HOSPITAL_BASED_OUTPATIENT_CLINIC_OR_DEPARTMENT_OTHER)
Admission: RE | Disposition: A | Discharge status: Home / Self Care | Payer: Self-pay | Source: Ambulatory Visit | Attending: Orthopedic Surgery

## 2013-09-22 ENCOUNTER — Ambulatory Visit (HOSPITAL_BASED_OUTPATIENT_CLINIC_OR_DEPARTMENT_OTHER)
Admission: RE | Admit: 2013-09-22 | Discharge: 2013-09-22 | Disposition: A | Discharge status: Home / Self Care | Payer: Medicare HMO | Source: Ambulatory Visit | Attending: Orthopedic Surgery | Admitting: Orthopedic Surgery

## 2013-09-22 ENCOUNTER — Ambulatory Visit (HOSPITAL_BASED_OUTPATIENT_CLINIC_OR_DEPARTMENT_OTHER): Payer: Medicare HMO | Admitting: Anesthesiology

## 2013-09-22 ENCOUNTER — Other Ambulatory Visit: Payer: Self-pay | Admitting: Physician Assistant

## 2013-09-22 ENCOUNTER — Encounter (HOSPITAL_BASED_OUTPATIENT_CLINIC_OR_DEPARTMENT_OTHER): Payer: Medicare HMO | Admitting: Anesthesiology

## 2013-09-22 ENCOUNTER — Encounter (HOSPITAL_BASED_OUTPATIENT_CLINIC_OR_DEPARTMENT_OTHER): Payer: Self-pay | Admitting: Anesthesiology

## 2013-09-22 DIAGNOSIS — K219 Gastro-esophageal reflux disease without esophagitis: Secondary | ICD-10-CM | POA: Insufficient documentation

## 2013-09-22 DIAGNOSIS — I10 Essential (primary) hypertension: Secondary | ICD-10-CM | POA: Diagnosis not present

## 2013-09-22 DIAGNOSIS — M25819 Other specified joint disorders, unspecified shoulder: Secondary | ICD-10-CM | POA: Diagnosis not present

## 2013-09-22 DIAGNOSIS — M7500 Adhesive capsulitis of unspecified shoulder: Secondary | ICD-10-CM | POA: Diagnosis present

## 2013-09-22 DIAGNOSIS — M24619 Ankylosis, unspecified shoulder: Secondary | ICD-10-CM | POA: Diagnosis not present

## 2013-09-22 DIAGNOSIS — M7580 Other shoulder lesions, unspecified shoulder: Secondary | ICD-10-CM

## 2013-09-22 DIAGNOSIS — M948X9 Other specified disorders of cartilage, unspecified sites: Secondary | ICD-10-CM | POA: Diagnosis not present

## 2013-09-22 DIAGNOSIS — Z882 Allergy status to sulfonamides status: Secondary | ICD-10-CM | POA: Diagnosis not present

## 2013-09-22 HISTORY — DX: Complete loss of teeth, unspecified cause, unspecified class: K08.109

## 2013-09-22 HISTORY — DX: Complete loss of teeth, unspecified cause, unspecified class: Z97.2

## 2013-09-22 HISTORY — DX: Gastro-esophageal reflux disease without esophagitis: K21.9

## 2013-09-22 HISTORY — PX: SHOULDER ARTHROSCOPY WITH SUBACROMIAL DECOMPRESSION AND OPEN ROTATOR C: SHX5688

## 2013-09-22 HISTORY — DX: Presence of spectacles and contact lenses: Z97.3

## 2013-09-22 HISTORY — DX: Essential (primary) hypertension: I10

## 2013-09-22 LAB — POCT I-STAT, CHEM 8
BUN: 26 mg/dL — ABNORMAL HIGH (ref 6–23)
Calcium, Ion: 1.21 mmol/L (ref 1.13–1.30)
Chloride: 105 mEq/L (ref 96–112)
Creatinine, Ser: 0.8 mg/dL (ref 0.50–1.10)
Glucose, Bld: 88 mg/dL (ref 70–99)
HEMATOCRIT: 48 % — AB (ref 36.0–46.0)
HEMOGLOBIN: 16.3 g/dL — AB (ref 12.0–15.0)
POTASSIUM: 4.7 meq/L (ref 3.7–5.3)
SODIUM: 141 meq/L (ref 137–147)
TCO2: 28 mmol/L (ref 0–100)

## 2013-09-22 SURGERY — SHOULDER ARTHROSCOPY WITH SUBACROMIAL DECOMPRESSION AND OPEN ROTATOR CUFF REPAIR, OPEN BICEPS TENDON REPAIR
Anesthesia: Regional | Site: Shoulder | Laterality: Left

## 2013-09-22 MED ORDER — DEXAMETHASONE SODIUM PHOSPHATE 4 MG/ML IJ SOLN
INTRAMUSCULAR | Status: DC | PRN
  Administered 2013-09-22: 10 mg via INTRAVENOUS

## 2013-09-22 MED ORDER — CHLORHEXIDINE GLUCONATE 4 % EX LIQD: 60.0000 mL | Freq: Once | CUTANEOUS | Status: DC

## 2013-09-22 MED ORDER — OXYCODONE-ACETAMINOPHEN 5-325 MG PO TABS: 1.0000 | ORAL_TABLET | ORAL | Status: DC | PRN

## 2013-09-22 MED ORDER — OXYCODONE HCL 5 MG/5ML PO SOLN: 5.0000 mg | Freq: Once | ORAL | Status: DC | PRN

## 2013-09-22 MED ORDER — ONDANSETRON HCL 4 MG/2ML IJ SOLN
INTRAMUSCULAR | Status: DC | PRN
  Administered 2013-09-22: 4 mg via INTRAVENOUS

## 2013-09-22 MED ORDER — ONDANSETRON HCL 4 MG PO TABS: 4.0000 mg | ORAL_TABLET | Freq: Three times a day (TID) | ORAL | Status: DC | PRN

## 2013-09-22 MED ORDER — CEFAZOLIN SODIUM-DEXTROSE 2-3 GM-% IV SOLR
INTRAVENOUS | Status: AC
  Filled 2013-09-22: qty 50

## 2013-09-22 MED ORDER — FENTANYL CITRATE 0.05 MG/ML IJ SOLN
INTRAMUSCULAR | Status: AC
  Filled 2013-09-22: qty 2

## 2013-09-22 MED ORDER — LACTATED RINGERS IV SOLN: INTRAVENOUS | Status: DC

## 2013-09-22 MED ORDER — SCOPOLAMINE 1 MG/3DAYS TD PT72
MEDICATED_PATCH | TRANSDERMAL | Status: AC
  Filled 2013-09-22: qty 1

## 2013-09-22 MED ORDER — LACTATED RINGERS IV SOLN
INTRAVENOUS | Status: DC
  Administered 2013-09-22 (×2): via INTRAVENOUS

## 2013-09-22 MED ORDER — SUCCINYLCHOLINE CHLORIDE 20 MG/ML IJ SOLN
INTRAMUSCULAR | Status: DC | PRN
  Administered 2013-09-22: 100 mg via INTRAVENOUS

## 2013-09-22 MED ORDER — FENTANYL CITRATE 0.05 MG/ML IJ SOLN
INTRAMUSCULAR | Status: DC | PRN
  Administered 2013-09-22 (×2): 25 ug via INTRAVENOUS

## 2013-09-22 MED ORDER — SCOPOLAMINE 1 MG/3DAYS TD PT72
1.0000 | MEDICATED_PATCH | Freq: Once | TRANSDERMAL | Status: DC
  Administered 2013-09-22: 1.5 mg via TRANSDERMAL

## 2013-09-22 MED ORDER — MIDAZOLAM HCL 2 MG/2ML IJ SOLN
INTRAMUSCULAR | Status: AC
  Filled 2013-09-22: qty 2

## 2013-09-22 MED ORDER — MIDAZOLAM HCL 2 MG/2ML IJ SOLN
1.0000 mg | INTRAMUSCULAR | Status: DC | PRN
  Administered 2013-09-22: 2 mg via INTRAVENOUS

## 2013-09-22 MED ORDER — OXYCODONE HCL 5 MG PO TABS: 5.0000 mg | ORAL_TABLET | Freq: Once | ORAL | Status: DC | PRN

## 2013-09-22 MED ORDER — PROPOFOL 10 MG/ML IV BOLUS
INTRAVENOUS | Status: DC | PRN
  Administered 2013-09-22: 120 mg via INTRAVENOUS

## 2013-09-22 MED ORDER — ROPIVACAINE HCL 5 MG/ML IJ SOLN
INTRAMUSCULAR | Status: DC | PRN
  Administered 2013-09-22: 30 mL via PERINEURAL

## 2013-09-22 MED ORDER — HYDROMORPHONE HCL PF 1 MG/ML IJ SOLN: 0.2500 mg | INTRAMUSCULAR | Status: DC | PRN

## 2013-09-22 MED ORDER — FENTANYL CITRATE 0.05 MG/ML IJ SOLN
50.0000 ug | INTRAMUSCULAR | Status: DC | PRN
  Administered 2013-09-22: 100 ug via INTRAVENOUS

## 2013-09-22 MED ORDER — LIDOCAINE HCL (CARDIAC) 20 MG/ML IV SOLN
INTRAVENOUS | Status: DC | PRN
  Administered 2013-09-22: 50 mg via INTRAVENOUS

## 2013-09-22 MED ORDER — CEFAZOLIN SODIUM-DEXTROSE 2-3 GM-% IV SOLR
2.0000 g | INTRAVENOUS | Status: AC
Start: 2013-09-22 — End: 2013-09-22
  Administered 2013-09-22: 2 g via INTRAVENOUS

## 2013-09-22 MED ORDER — SODIUM CHLORIDE 0.9 % IR SOLN
Status: DC | PRN
  Administered 2013-09-22: 6000 mL

## 2013-09-22 SURGICAL SUPPLY — 70 items
BENZOIN TINCTURE PRP APPL 2/3 (GAUZE/BANDAGES/DRESSINGS) IMPLANT
BLADE CUTTER GATOR 3.5 (BLADE) ×3 IMPLANT
BLADE CUTTER MENIS 5.5 (BLADE) IMPLANT
BLADE GREAT WHITE 4.2 (BLADE) ×2 IMPLANT
BLADE GREAT WHITE 4.2MM (BLADE) ×1
BLADE SURG 15 STRL LF DISP TIS (BLADE) ×1 IMPLANT
BLADE SURG 15 STRL SS (BLADE) ×2
BUR OVAL 6.0 (BURR) ×3 IMPLANT
CANISTER SUCT 3000ML (MISCELLANEOUS) IMPLANT
CANNULA DRY DOC 8X75 (CANNULA) IMPLANT
CANNULA TWIST IN 8.25X7CM (CANNULA) IMPLANT
CLOSURE WOUND 1/2 X4 (GAUZE/BANDAGES/DRESSINGS)
DECANTER SPIKE VIAL GLASS SM (MISCELLANEOUS) IMPLANT
DRAPE STERI 35X30 U-POUCH (DRAPES) ×3 IMPLANT
DRAPE U-SHAPE 47X51 STRL (DRAPES) ×3 IMPLANT
DRAPE U-SHAPE 76X120 STRL (DRAPES) ×6 IMPLANT
DRSG PAD ABDOMINAL 8X10 ST (GAUZE/BANDAGES/DRESSINGS) ×3 IMPLANT
DURAPREP 26ML APPLICATOR (WOUND CARE) ×3 IMPLANT
ELECT MENISCUS 165MM 90D (ELECTRODE) ×3 IMPLANT
ELECT NEEDLE TIP 2.8 STRL (NEEDLE) IMPLANT
ELECT REM PT RETURN 9FT ADLT (ELECTROSURGICAL) ×3
ELECTRODE REM PT RTRN 9FT ADLT (ELECTROSURGICAL) ×1 IMPLANT
GAUZE SPONGE 4X4 12PLY STRL (GAUZE/BANDAGES/DRESSINGS) ×6 IMPLANT
GAUZE XEROFORM 1X8 LF (GAUZE/BANDAGES/DRESSINGS) ×3 IMPLANT
GLOVE BIOGEL PI IND STRL 7.0 (GLOVE) ×2 IMPLANT
GLOVE BIOGEL PI INDICATOR 7.0 (GLOVE) ×4
GLOVE ECLIPSE 6.5 STRL STRAW (GLOVE) ×6 IMPLANT
GLOVE EXAM NITRILE LRG STRL (GLOVE) ×3 IMPLANT
GLOVE ORTHO TXT STRL SZ7.5 (GLOVE) ×3 IMPLANT
GOWN STRL REUS W/ TWL LRG LVL3 (GOWN DISPOSABLE) ×2 IMPLANT
GOWN STRL REUS W/ TWL XL LVL3 (GOWN DISPOSABLE) ×1 IMPLANT
GOWN STRL REUS W/TWL LRG LVL3 (GOWN DISPOSABLE) ×4
GOWN STRL REUS W/TWL XL LVL3 (GOWN DISPOSABLE) ×2
IV NS IRRIG 3000ML ARTHROMATIC (IV SOLUTION) ×6 IMPLANT
MANIFOLD NEPTUNE II (INSTRUMENTS) ×3 IMPLANT
NDL SUT 6 .5 CRC .975X.05 MAYO (NEEDLE) IMPLANT
NEEDLE MAYO TAPER (NEEDLE)
NEEDLE SCORPION MULTI FIRE (NEEDLE) IMPLANT
NS IRRIG 1000ML POUR BTL (IV SOLUTION) IMPLANT
PACK ARTHROSCOPY DSU (CUSTOM PROCEDURE TRAY) ×3 IMPLANT
PACK BASIN DAY SURGERY FS (CUSTOM PROCEDURE TRAY) ×3 IMPLANT
PASSER SUT SWANSON 36MM LOOP (INSTRUMENTS) IMPLANT
PENCIL BUTTON HOLSTER BLD 10FT (ELECTRODE) ×3 IMPLANT
SET ARTHROSCOPY TUBING (MISCELLANEOUS) ×2
SET ARTHROSCOPY TUBING LN (MISCELLANEOUS) ×1 IMPLANT
SLEEVE SCD COMPRESS KNEE MED (MISCELLANEOUS) ×3 IMPLANT
SLING ARM IMMOBILIZER LRG (SOFTGOODS) IMPLANT
SLING ARM IMMOBILIZER MED (SOFTGOODS) IMPLANT
SLING ARM LRG ADULT FOAM STRAP (SOFTGOODS) IMPLANT
SLING ARM MED ADULT FOAM STRAP (SOFTGOODS) ×3 IMPLANT
SLING ARM XL FOAM STRAP (SOFTGOODS) IMPLANT
SPONGE LAP 4X18 X RAY DECT (DISPOSABLE) IMPLANT
STRIP CLOSURE SKIN 1/2X4 (GAUZE/BANDAGES/DRESSINGS) IMPLANT
SUCTION FRAZIER TIP 10 FR DISP (SUCTIONS) IMPLANT
SUT ETHIBOND 2 OS 4 DA (SUTURE) IMPLANT
SUT ETHILON 2 0 FS 18 (SUTURE) IMPLANT
SUT ETHILON 3 0 PS 1 (SUTURE) ×3 IMPLANT
SUT FIBERWIRE #2 38 T-5 BLUE (SUTURE)
SUT RETRIEVER MED (INSTRUMENTS) IMPLANT
SUT TIGER TAPE 7 IN WHITE (SUTURE) IMPLANT
SUT VIC AB 0 CT1 27 (SUTURE)
SUT VIC AB 0 CT1 27XBRD ANBCTR (SUTURE) IMPLANT
SUT VIC AB 2-0 SH 27 (SUTURE)
SUT VIC AB 2-0 SH 27XBRD (SUTURE) IMPLANT
SUT VIC AB 3-0 FS2 27 (SUTURE) IMPLANT
SUTURE FIBERWR #2 38 T-5 BLUE (SUTURE) IMPLANT
TAPE FIBER 2MM 7IN #2 BLUE (SUTURE) IMPLANT
TOWEL OR 17X24 6PK STRL BLUE (TOWEL DISPOSABLE) ×3 IMPLANT
WATER STERILE IRR 1000ML POUR (IV SOLUTION) ×3 IMPLANT
YANKAUER SUCT BULB TIP NO VENT (SUCTIONS) IMPLANT

## 2013-09-22 NOTE — Anesthesia Procedure Notes (Addendum)
Anesthesia Regional Block:  Interscalene brachial plexus block  Pre-Anesthetic Checklist: ,, timeout performed, Correct Patient, Correct Site, Correct Laterality, Correct Procedure, Correct Position, site marked, Risks and benefits discussed, pre-op evaluation,  At surgeon's request and post-op pain management  Laterality: Left  Prep: Maximum Sterile Barrier Precautions used and chloraprep       Needles:  Injection technique: Single-shot  Needle Type: Echogenic Stimulator Needle     Needle Length: 5cm 5 cm Needle Gauge: 22 and 22 G    Additional Needles:  Procedures: ultrasound guided (picture in chart) and nerve stimulator Interscalene brachial plexus block  Nerve Stimulator or Paresthesia:  Response: Biceps response,   Additional Responses:   Narrative:  Start time: 09/22/2013 10:32 AM End time: 09/22/2013 10:40 AM Injection made incrementally with aspirations every 5 mL. Anesthesiologist: Ola Spurr, MD  Additional Notes: 2% Lidocaine skin wheel.    Procedure Name: Intubation Performed by: Terrance Mass Pre-anesthesia Checklist: Patient identified, Timeout performed, Emergency Drugs available, Suction available and Patient being monitored Patient Re-evaluated:Patient Re-evaluated prior to inductionOxygen Delivery Method: Circle system utilized Preoxygenation: Pre-oxygenation with 100% oxygen Intubation Type: IV induction Ventilation: Mask ventilation without difficulty Laryngoscope Size: Miller and 2 Grade View: Grade I Tube type: Oral Tube size: 7.0 mm Number of attempts: 1 Airway Equipment and Method: Stylet Placement Confirmation: ETT inserted through vocal cords under direct vision,  breath sounds checked- equal and bilateral and positive ETCO2 Secured at: 20 cm Tube secured with: Tape Dental Injury: Teeth and Oropharynx as per pre-operative assessment

## 2013-09-22 NOTE — Anesthesia Preprocedure Evaluation (Addendum)
Anesthesia Evaluation  Patient identified by MRN, date of birth, ID band Patient awake    Reviewed: Allergy & Precautions, H&P , NPO status , Patient's Chart, lab work & pertinent test results  Airway Mallampati: I TM Distance: >3 FB Neck ROM: Full    Dental no notable dental hx. (+) Edentulous Upper, Edentulous Lower, Dental Advisory Given   Pulmonary neg pulmonary ROS,  breath sounds clear to auscultation  Pulmonary exam normal       Cardiovascular hypertension, On Medications Rhythm:Regular Rate:Normal     Neuro/Psych negative neurological ROS  negative psych ROS   GI/Hepatic Neg liver ROS, GERD-  Medicated and Controlled,  Endo/Other  negative endocrine ROS  Renal/GU negative Renal ROS  negative genitourinary   Musculoskeletal   Abdominal   Peds  Hematology negative hematology ROS (+)   Anesthesia Other Findings   Reproductive/Obstetrics negative OB ROS                          Anesthesia Physical Anesthesia Plan  ASA: II  Anesthesia Plan: General and Regional   Post-op Pain Management:    Induction: Intravenous  Airway Management Planned: Oral ETT  Additional Equipment:   Intra-op Plan:   Post-operative Plan: Extubation in OR  Informed Consent: I have reviewed the patients History and Physical, chart, labs and discussed the procedure including the risks, benefits and alternatives for the proposed anesthesia with the patient or authorized representative who has indicated his/her understanding and acceptance.   Dental advisory given  Plan Discussed with: CRNA  Anesthesia Plan Comments:         Anesthesia Quick Evaluation

## 2013-09-22 NOTE — Progress Notes (Signed)
Assisted Dr. Ola Spurr with left, ultrasound guided, interscalene  block. Side rails up, monitors on throughout procedure. See vital signs in flow sheet. Tolerated Procedure well.

## 2013-09-22 NOTE — Anesthesia Postprocedure Evaluation (Signed)
  Anesthesia Post-op Note  Patient: Renee Wilkerson  Procedure(s) Performed: Procedure(s): EXAM UNDER ANESTHESIA, MANIPULATION OF SHOULDER, LEFT SHOULDER ARTHROSCOPY DEBRIDEMENT EXTENSIVE, DISTAL CLAVICULECTOMY, LYSIS OF ADHESIONS, DECOMPRESSION SUBACROMIAL PARTIAL ACROMIOPLASTY (Left)  Patient Location: PACU  Anesthesia Type:General and block  Level of Consciousness: awake and alert   Airway and Oxygen Therapy: Patient Spontanous Breathing  Post-op Pain: none  Post-op Assessment: Post-op Vital signs reviewed, Patient's Cardiovascular Status Stable and Respiratory Function Stable  Post-op Vital Signs: Reviewed  Filed Vitals:   09/22/13 1215  BP: 144/78  Pulse: 87  Temp:   Resp: 20    Complications: No apparent anesthesia complications

## 2013-09-22 NOTE — Discharge Instructions (Signed)
Shouder arthroscopy, partial rotator cuff tear debridement subacromial decompression Care After Instructions Refer to this sheet in the next few weeks. These discharge instructions provide you with general information on caring for yourself after you leave the hospital. Your caregiver may also give you specific instructions. Your treatment has been planned according to the most current medical practices available, but unavoidable complications sometimes occur. If you have any problems or questions after discharge, please call your caregiver. HOME INSTRUCTIONS  Wear sling for the next 2 days.  Remove bandages and apply band-aids over incisions in 3 days.  May shower in 3 days, but do not soak incisions.  May apply ice for up to 20 minutes at a time for pain and swelling.  Follow up appointment in one week.   Eat a well-balanced diet.  Avoid lifting or driving until you are instructed otherwise.  Make an appointment to see your caregiver for stitches (suture) or staple removal as directed.   SEEK MEDICAL CARE IF: You have swelling of your calf or leg.  You develop shortness of breath or chest pain.  You have redness, swelling, or increasing pain in the wound.  There is pus or any unusual drainage coming from the surgical site.  You notice a bad smell coming from the surgical site or dressing.  The surgical site breaks open after sutures or staples have been removed.  There is persistent bleeding from the suture or staple line.  You are getting worse or are not improving.  You have any other questions or concerns.  SEEK IMMEDIATE MEDICAL CARE IF:  You have a fever greater than 101 You develop a rash.  You have difficulty breathing.  You develop any reaction or side effects to medicines given.  Your knee motion is decreasing rather than improving.  MAKE SURE YOU:  Understand these instructions.  Will watch your condition.  Will get help right away if you are not doing well or get worse.     Post Anesthesia Home Care Instructions  Activity: Get plenty of rest for the remainder of the day. A responsible adult should stay with you for 24 hours following the procedure.  For the next 24 hours, DO NOT: -Drive a car -Paediatric nurse -Drink alcoholic beverages -Take any medication unless instructed by your physician -Make any legal decisions or sign important papers.  Meals: Start with liquid foods such as gelatin or soup. Progress to regular foods as tolerated. Avoid greasy, spicy, heavy foods. If nausea and/or vomiting occur, drink only clear liquids until the nausea and/or vomiting subsides. Call your physician if vomiting continues.  Special Instructions/Symptoms: Your throat may feel dry or sore from the anesthesia or the breathing tube placed in your throat during surgery. If this causes discomfort, gargle with warm salt water. The discomfort should disappear within 24 hours.  Regional Anesthesia Blocks  1. Numbness or the inability to move the "blocked" extremity may last from 3-48 hours after placement. The length of time depends on the medication injected and your individual response to the medication. If the numbness is not going away after 48 hours, call your surgeon.  2. The extremity that is blocked will need to be protected until the numbness is gone and the  Strength has returned. Because you cannot feel it, you will need to take extra care to avoid injury. Because it may be weak, you may have difficulty moving it or using it. You may not know what position it is in without looking at it  while the block is in effect.  3. For blocks in the legs and feet, returning to weight bearing and walking needs to be done carefully. You will need to wait until the numbness is entirely gone and the strength has returned. You should be able to move your leg and foot normally before you try and bear weight or walk. You will need someone to be with you when you first try to ensure  you do not fall and possibly risk injury.  4. Bruising and tenderness at the needle site are common side effects and will resolve in a few days.  5. Persistent numbness or new problems with movement should be communicated to the surgeon or the [facility] [phone] [place] [phone]).

## 2013-09-22 NOTE — Transfer of Care (Signed)
Immediate Anesthesia Transfer of Care Note  Patient: Renee Wilkerson  Procedure(s) Performed: Procedure(s): EXAM UNDER ANESTHESIA, MANIPULATION OF SHOULDER, LEFT SHOULDER ARTHROSCOPY DEBRIDEMENT EXTENSIVE, DISTAL CLAVICULECTOMY, LYSIS OF ADHESIONS, DECOMPRESSION SUBACROMIAL PARTIAL ACROMIOPLASTY (Left)  Patient Location: PACU  Anesthesia Type:GA combined with regional for post-op pain  Level of Consciousness: sedated and patient cooperative  Airway & Oxygen Therapy: Patient Spontanous Breathing and Patient connected to face mask oxygen  Post-op Assessment: Report given to PACU RN and Post -op Vital signs reviewed and stable  Post vital signs: Reviewed and stable  Complications: No apparent anesthesia complications

## 2013-09-22 NOTE — Interval H&P Note (Signed)
History and Physical Interval Note:  09/22/2013 7:33 AM  Renee Wilkerson  has presented today for surgery, with the diagnosis of [place] DIAGNOSIS, DISORDERS OF BURSAE AND TENDONS IN SHOULDER REGION UNSPECIFIED BICIPITAL   The various methods of treatment have been discussed with the patient and family. After consideration of risks, benefits and other options for treatment, the patient has consented to  Procedure(s): LEFT SHOULDER ARTHROSCOPY DEBRIDEMENT EXTENTSIVE DISTAL CLAVICULECTOMY DECOMPRESSION SUBACROMIAL PARTIAL ACROMIOPLASTY WITH BICEP TENODESIS  (Left) as a surgical intervention .  The patient's history has been reviewed, patient examined, no change in status, stable for surgery.  I have reviewed the patient's chart and labs.  Questions were answered to the patient's satisfaction.     Terre Hanneman F

## 2013-09-23 ENCOUNTER — Encounter (HOSPITAL_BASED_OUTPATIENT_CLINIC_OR_DEPARTMENT_OTHER): Payer: Self-pay | Admitting: Orthopedic Surgery

## 2013-09-26 NOTE — Op Note (Signed)
NAMEVALENE, VILLA NO.:  [id number]  MEDICAL RECORD NO.:  [id number]  LOCATION:                                 FACILITY:  PHYSICIAN:  Ninetta Lights, M.D. 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  Impingement distal clavicle osteolysis.  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  Impingement distal clavicle osteolysis.  Partial tearing rotator cuff below.  PROCEDURE:  Left shoulder exam, manipulation under anesthesia. Arthroscopy, lysis of intra and extra-articular adhesions.  Debridement of rotator cuff undersurface.  Bursectomy, acromioplasty, coracoacromial ligament release.  Excision of distal clavicle.  SURGEON:  Ninetta Lights, MD  ASSISTANT:  Eula Listen, PA, present throughout the entire case and necessary for timely completion of procedure.  ANESTHESIA:  General.  BLOOD LOSS:  Minimal.  SPECIMENS:  None.  CULTURES:  None.  COMPLICATIONS:  None.  DRESSING:  Soft compressive sling.  DESCRIPTION OF PROCEDURE:  The patient was brought to the operating room, placed on the operating table in supine position.  After adequate anesthesia had been obtained, shoulder examined.  50% of the motion active and passive.  Gently manipulated, break up all adhesions achieving full motion and stable shoulder.  Placed in beach-chair position on the shoulder positioner, prepped and draped in usual sterile fashion.  Three portals anterior, posterior, and lateral.  Arthroscope introduced, shoulder distended and inspected.  A lot of adhesions debrided.  Tearing of the capsule from manipulation debrided.  A little roughening back of the labrum debrided.  Undersurface crescent region with  supraspinatus debrided.  After that all structures were looked quite good.  Biceps tendon and biceps anchor intact.  Cannula redirected subacromially.  Bursitis subacromial adhesions cleared out. Acromioplasty from type 2 to type 1 acromion releasing CA ligament. Grade 4 changes spurring AC joint.  Periarticular spurs lateral centimeter of clavicle resected.  Adequacy of decompression and debridement confirmed viewing from all portals.  Instruments and fluid removed.  Portals were closed with nylon.  Sterile compressive dressing applied.  Sling applied.  Anesthesia reversed.  Brought to the recovery room.  Tolerated the surgery well, no complications.     Ninetta Lights, M.D.     DFM/MEDQ  D:  09/22/2013  T:  09/22/2013  Job:  [id number]

## 2013-11-29 ENCOUNTER — Other Ambulatory Visit: Payer: Self-pay | Admitting: Internal Medicine

## 2013-11-29 DIAGNOSIS — Z853 Personal history of malignant neoplasm of breast: Secondary | ICD-10-CM

## 2013-12-16 ENCOUNTER — Ambulatory Visit (HOSPITAL_BASED_OUTPATIENT_CLINIC_OR_DEPARTMENT_OTHER): Payer: Medicare HMO | Admitting: Hematology and Oncology

## 2013-12-16 ENCOUNTER — Encounter: Payer: Self-pay | Admitting: Hematology and Oncology

## 2013-12-16 VITALS — BP 149/82 | HR 70 | Temp 97.9°F | Resp 18 | Ht 62.0 in | Wt 133.8 lb

## 2013-12-16 DIAGNOSIS — B379 Candidiasis, unspecified: Secondary | ICD-10-CM

## 2013-12-16 DIAGNOSIS — Z853 Personal history of malignant neoplasm of breast: Secondary | ICD-10-CM

## 2013-12-16 DIAGNOSIS — D0512 Intraductal carcinoma in situ of left breast: Secondary | ICD-10-CM

## 2013-12-16 HISTORY — DX: Candidiasis, unspecified: B37.9

## 2013-12-16 MED ORDER — NYSTATIN EX POWD: 1.0000 "application " | Freq: Two times a day (BID) | CUTANEOUS | Status: DC

## 2013-12-16 NOTE — Progress Notes (Signed)
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 She received adjuvant radiation therapy until 01/17/2008 and was placed on tamoxifen until December 2014.  INTERVAL HISTORY: Please see below for problem oriented charting. She continues to have mild hot flashes. She had bronchitis recently and was treated with antibiotics, complicated by skin infection and was treated by topical antifungal medicine. It is improving. She denies any recent abnormal breast examination, palpable mass, abnormal breast appearance or nipple changes   REVIEW OF SYSTEMS:   Constitutional: Denies fevers, chills or abnormal weight loss Eyes: Denies blurriness of vision Ears, nose, mouth, throat, and face: Denies mucositis or sore throat Respiratory: Denies cough, dyspnea or wheezes Cardiovascular: Denies palpitation, chest discomfort or lower extremity swelling Gastrointestinal:  Denies nausea, heartburn or change in bowel habits Lymphatics: Denies new lymphadenopathy or easy bruising Neurological:Denies numbness, tingling or new weaknesses Behavioral/Psych: Mood is stable, no new changes  All other systems were reviewed with the patient and are negative.  I have reviewed the past medical history, past surgical history, social history and family history with the patient and they are unchanged from previous note.  ALLERGIES:  is allergic to sulfa antibiotics.  MEDICATIONS:  Current Outpatient Prescriptions  Medication Sig Dispense Refill  . esomeprazole (NEXIUM) 20 MG capsule Take 20 mg by mouth 2 (two) times daily before a meal.    . losartan (COZAAR) 50 MG tablet Take 50 mg by mouth daily.    . ondansetron  (ZOFRAN) 4 MG tablet Take 1 tablet (4 mg total) by mouth every 8 (eight) hours as needed for nausea or vomiting. 40 tablet 0  . oxyCODONE-acetaminophen (ROXICET) 5-325 MG per tablet Take 1-2 tablets by mouth every 4 (four) hours as needed. 60 tablet 0  . Zolpidem Tartrate (AMBIEN PO) Take by mouth.      . NYSTATIN, TOPICAL, POWD Apply 1 application topically 2 (two) times daily.  0   No current facility-administered medications for this visit.    PHYSICAL EXAMINATION: ECOG PERFORMANCE STATUS: 0 - Asymptomatic  Filed Vitals:   12/16/13 1403  BP: 149/82  Pulse: 70  Temp: 97.9 F (36.6 C)  Resp: 18   Filed Weights   12/16/13 1403  Weight: 133 lb 12.8 oz (60.691 kg)    GENERAL:alert, no distress and comfortable SKIN: skin color, texture, turgor are normal, no rashes or significant lesions EYES: normal, Conjunctiva are pink and non-injected, sclera clear OROPHARYNX:no exudate, no erythema and lips, buccal mucosa, and tongue normal  NECK: supple, thyroid normal size, non-tender, without nodularity LYMPH:  no palpable lymphadenopathy in the cervical, axillary or inguinal LUNGS: clear to auscultation and percussion with normal breathing effort HEART: regular rate & rhythm and no murmurs and no lower extremity edema ABDOMEN:abdomen soft, non-tender and normal bowel sounds Musculoskeletal:no cyanosis of digits and no clubbing  NEURO: alert & oriented x 3 with fluent speech, no focal motor/sensory deficits Bilateral breast examination were performed. Well-healed lumpectomy scar with no abnormalities. LABORATORY DATA:  I have reviewed the data as listed    Component Value Date/Time   NA 141 09/22/2013 1021   NA 143 05/12/2012 1413   K 4.7 09/22/2013 1021   K 4.0 05/12/2012 1413   CL 105 09/22/2013 1021  CL 111* 05/12/2012 1413   CO2 26 05/12/2012 1413   CO2 26 05/12/2011 1432   GLUCOSE 88 09/22/2013 1021   GLUCOSE 83 05/12/2012 1413   BUN 26* 09/22/2013 1021   BUN 14.4  05/12/2012 1413   CREATININE 0.80 09/22/2013 1021   CREATININE 0.8 05/12/2012 1413   CALCIUM 8.4 05/12/2012 1413   CALCIUM 9.3 05/12/2011 1432   PROT 5.9* 05/12/2012 1413   PROT 5.7* 05/12/2011 1432   ALBUMIN 3.4* 05/12/2012 1413   ALBUMIN 4.0 05/12/2011 1432   AST 12 05/12/2012 1413   AST 11 05/12/2011 1432   ALT 7 05/12/2012 1413   ALT 10 05/12/2011 1432   ALKPHOS 55 05/12/2012 1413   ALKPHOS 49 05/12/2011 1432   BILITOT 0.33 05/12/2012 1413   BILITOT 0.4 05/12/2011 1432   GFRNONAA >60 10/22/2007 1300   GFRAA  10/22/2007 1300    >60        The eGFR has been calculated using the MDRD equation. This calculation has not been validated in all clinical    No results found for: SPEP, UPEP  Lab Results  Component Value Date   WBC 5.0 05/12/2012   NEUTROABS 2.0 05/12/2012   HGB 16.3* 09/22/2013   HCT 48.0* 09/22/2013   MCV 93.3 05/12/2012   PLT 157 05/12/2012      Chemistry      Component Value Date/Time   NA 141 09/22/2013 1021   NA 143 05/12/2012 1413   K 4.7 09/22/2013 1021   K 4.0 05/12/2012 1413   CL 105 09/22/2013 1021   CL 111* 05/12/2012 1413   CO2 26 05/12/2012 1413   CO2 26 05/12/2011 1432   BUN 26* 09/22/2013 1021   BUN 14.4 05/12/2012 1413   CREATININE 0.80 09/22/2013 1021   CREATININE 0.8 05/12/2012 1413      Component Value Date/Time   CALCIUM 8.4 05/12/2012 1413   CALCIUM 9.3 05/12/2011 1432   ALKPHOS 55 05/12/2012 1413   ALKPHOS 49 05/12/2011 1432   AST 12 05/12/2012 1413   AST 11 05/12/2011 1432   ALT 7 05/12/2012 1413   ALT 10 05/12/2011 1432   BILITOT 0.33 05/12/2012 1413   BILITOT 0.4 05/12/2011 1432      ASSESSMENT & PLAN:  DCIS (ductal carcinoma in situ) of breast Clinically, she has no recurrence. I recommend continue yearly mammogram, history of physical examination with follow-up with PCP. She agree. I would discharge the patient from the clinic.    All questions were answered. The patient knows to call the clinic with any  problems, questions or concerns. No barriers to learning was detected. I spent 15 minutes counseling the patient face to face. The total time spent in the appointment was 20 minutes and more than 50% was on counseling and review of test results     [facility], [place], MD 12/16/2013 4:10 PM

## 2013-12-16 NOTE — Assessment & Plan Note (Signed)
Clinically, she has no recurrence. I recommend continue yearly mammogram, history of physical examination with follow-up with PCP. She agree. I would discharge the patient from the clinic.

## 2013-12-20 ENCOUNTER — Ambulatory Visit
Admission: RE | Admit: 2013-12-20 | Discharge: 2013-12-20 | Disposition: A | Discharge status: Home / Self Care | Payer: Commercial Managed Care - HMO | Source: Ambulatory Visit | Attending: Internal Medicine | Admitting: Internal Medicine

## 2013-12-20 DIAGNOSIS — Z853 Personal history of malignant neoplasm of breast: Secondary | ICD-10-CM

## 2013-12-26 ENCOUNTER — Ambulatory Visit: Admission: RE | Admit: 2013-12-26 | Payer: Commercial Managed Care - HMO | Source: Ambulatory Visit

## 2013-12-26 ENCOUNTER — Other Ambulatory Visit: Payer: Self-pay | Admitting: Internal Medicine

## 2013-12-26 ENCOUNTER — Ambulatory Visit
Admission: RE | Admit: 2013-12-26 | Discharge: 2013-12-26 | Disposition: A | Discharge status: Home / Self Care | Payer: Commercial Managed Care - HMO | Source: Ambulatory Visit | Attending: Internal Medicine | Admitting: Internal Medicine

## 2013-12-26 DIAGNOSIS — Z853 Personal history of malignant neoplasm of breast: Secondary | ICD-10-CM

## 2014-11-27 ENCOUNTER — Other Ambulatory Visit: Payer: Self-pay | Admitting: Internal Medicine

## 2014-11-27 DIAGNOSIS — Z853 Personal history of malignant neoplasm of breast: Secondary | ICD-10-CM

## 2014-12-29 ENCOUNTER — Ambulatory Visit
Admission: RE | Admit: 2014-12-29 | Discharge: 2014-12-29 | Disposition: A | Discharge status: Home / Self Care | Payer: Medicare HMO | Source: Ambulatory Visit | Attending: Internal Medicine | Admitting: Internal Medicine

## 2014-12-29 DIAGNOSIS — Z853 Personal history of malignant neoplasm of breast: Secondary | ICD-10-CM

## 2015-03-02 ENCOUNTER — Other Ambulatory Visit: Payer: Self-pay | Admitting: Otolaryngology

## 2015-03-02 DIAGNOSIS — H9201 Otalgia, right ear: Secondary | ICD-10-CM

## 2015-03-09 ENCOUNTER — Ambulatory Visit
Admission: RE | Admit: 2015-03-09 | Discharge: 2015-03-09 | Disposition: A | Discharge status: Home / Self Care | Payer: Medicare HMO | Source: Ambulatory Visit | Attending: Otolaryngology | Admitting: Otolaryngology

## 2015-03-09 DIAGNOSIS — H9201 Otalgia, right ear: Secondary | ICD-10-CM

## 2015-03-09 MED ORDER — IOPAMIDOL (ISOVUE-300) INJECTION 61%
75.0000 mL | Freq: Once | INTRAVENOUS | Status: AC | PRN
  Administered 2015-03-09: 75 mL via INTRAVENOUS

## 2015-07-27 ENCOUNTER — Encounter: Payer: Self-pay | Admitting: Genetic Counselor

## 2015-09-12 ENCOUNTER — Telehealth: Payer: Self-pay | Admitting: Genetic Counselor

## 2015-09-12 NOTE — Telephone Encounter (Signed)
Pt called in to verify the appt date/time. Reviewed address and directions

## 2015-09-14 ENCOUNTER — Telehealth: Payer: Self-pay | Admitting: Genetic Counselor

## 2015-09-14 ENCOUNTER — Encounter: Payer: Self-pay | Admitting: Genetic Counselor

## 2015-09-14 NOTE — Telephone Encounter (Addendum)
Opened in error

## 2015-09-14 NOTE — Telephone Encounter (Signed)
Error again

## 2015-09-17 ENCOUNTER — Ambulatory Visit (HOSPITAL_BASED_OUTPATIENT_CLINIC_OR_DEPARTMENT_OTHER): Payer: Medicare HMO | Admitting: Genetic Counselor

## 2015-09-17 ENCOUNTER — Other Ambulatory Visit: Payer: Medicare HMO

## 2015-09-17 ENCOUNTER — Encounter: Payer: Self-pay | Admitting: Genetic Counselor

## 2015-09-17 DIAGNOSIS — Z8042 Family history of malignant neoplasm of prostate: Secondary | ICD-10-CM | POA: Insufficient documentation

## 2015-09-17 DIAGNOSIS — D0512 Intraductal carcinoma in situ of left breast: Secondary | ICD-10-CM

## 2015-09-17 DIAGNOSIS — Z800 Family history of malignant neoplasm of digestive organs: Secondary | ICD-10-CM | POA: Diagnosis not present

## 2015-09-17 DIAGNOSIS — Z803 Family history of malignant neoplasm of breast: Secondary | ICD-10-CM | POA: Insufficient documentation

## 2015-09-17 DIAGNOSIS — Z315 Encounter for genetic counseling: Secondary | ICD-10-CM

## 2015-09-17 NOTE — Progress Notes (Signed)
REFERRING PROVIDER: Raina Mina, MD [address] [place], [place] [address]  PRIMARY PROVIDER:  Gilford Rile, MD  PRIMARY REASON FOR VISIT:  1. DCIS (ductal carcinoma in situ) of breast, left   2. Family history of breast cancer   3. Family history of pancreatic cancer   4. Family history of colon cancer   5. Family history of prostate cancer      HISTORY OF PRESENT ILLNESS:   Ms. Creason, a 70 y.o. female, was seen for a [facility] cancer genetics consultation at the request of Dr. Bea Graff due to a personal and family history of cancer.  Ms. Viswanathan presents to clinic today to discuss the possibility of a hereditary predisposition to cancer, genetic testing, and to further clarify her future cancer risks, as well as potential cancer risks for family members.   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 This was treated with partial mastectomy and radiaiton.  She had BRCA testing at that time which was negative.  Recently, Ms. Kleine received our letter offering patients who had tested prior to 2013 to come back for an evaluation to determine if they would be eligible for testing.      CANCER HISTORY:   No history exists.     HORMONAL RISK FACTORS:  Menarche was at age 3.  First live birth at age 42.  OCP use for approximately 10 years.  Ovaries intact: yes.  Hysterectomy: yes.  Menopausal status: postmenopausal.  HRT use: 0 years. Colonoscopy: yes; had several TAs. Mammogram within the last year: yes. Number of breast biopsies: 1. Up to date with pelvic exams:  yes. Any excessive radiation exposure in the past:  no  Past Medical History:  Diagnosis Date  . Arthritis   . Breast cancer ([place])   . Family history of breast cancer   . Family history of colon cancer   . Family history of pancreatic cancer   . Family history of prostate cancer   . Full dentures   . GERD (gastroesophageal reflux disease)   . Hot flashes   .  Hypertension   . Thyroid disease   . Wears glasses   . Yeast infection 12/16/2013    Past Surgical History:  Procedure Laterality Date  . ABDOMINAL HYSTERECTOMY  1980   no ovaries  . BREAST SURGERY  2009   rt lump-radiation-tamoxifin  . COLONOSCOPY    . LITHOTRIPSY    . SHOULDER ARTHROSCOPY WITH SUBACROMIAL DECOMPRESSION AND OPEN ROTATOR C Left 09/22/2013   Procedure: EXAM UNDER ANESTHESIA, MANIPULATION OF SHOULDER, LEFT SHOULDER ARTHROSCOPY DEBRIDEMENT EXTENSIVE, DISTAL CLAVICULECTOMY, LYSIS OF ADHESIONS, DECOMPRESSION SUBACROMIAL PARTIAL ACROMIOPLASTY;  Surgeon: Ninetta Lights, MD;  Location: [facility];  Service: Orthopedics;  Laterality: Left;  . THYROIDECTOMY, PARTIAL  1988  . TONSILLECTOMY      Social History   Social History  . Marital status: Married    Spouse name: N/A  . Number of children: 3  . Years of education: N/A   Social History Main Topics  . Smoking status: Never Smoker  . Smokeless tobacco: Never Used  . Alcohol use No  . Drug use: No  . Sexual activity: Not on file   Other Topics Concern  . Not on file   Social History Narrative  . No narrative on file     FAMILY HISTORY:  We obtained a detailed, 4-generation family history.  Significant diagnoses are listed below: Family History  Problem Relation Age of Onset  . Diabetes Mother   . Cancer Father 40    colon cancer  . Breast cancer Sister 13    bilateral breast cancer, 2nd cancer at 76  . Lung cancer Maternal Aunt   . Pancreatic cancer Paternal Aunt   . Prostate cancer Paternal Uncle   . Leukemia Maternal Grandmother   . Breast cancer Paternal Aunt   . Breast cancer Paternal Aunt     dx <50  . Breast cancer Paternal Aunt   . Colon cancer Paternal Aunt   . Breast cancer Paternal Aunt   . Prostate cancer Paternal Uncle   . Breast cancer Cousin   . Breast cancer Cousin     The patient has two sisters and a brother.  One sister had bilateral breast cancer.  Her mother is  alive and cancer free.  She had seven siblings, one who had lung cancer.  There is no other reported family history of cancer on the maternal side.  The patient's father had colon cancer in his 55s.  He was one of 14 children.  He had two brothers who had prostate cancer, four sisters with breast cancer, one who also may have had colon cancer, and a sister with pancreatic cancer.  There are several paternal cousins with breast cancer as well.  Patient's maternal ancestors are of Caucasian descent, and paternal ancestors are of Caucasian descent. There is no reported Ashkenazi Jewish ancestry. There is no known consanguinity.  GENETIC COUNSELING ASSESSMENT: CARSEN MACHI is a 70 y.o. female with a personal and family history of cancer which is somewhat suggestive of a hereditary cancer syndrome and predisposition to cancer. We, therefore, discussed and recommended the following at today's visit.   DISCUSSION: We discussed that about 5-10% of breast cancer is hereditary, with most cases of breast cancer due to BRCA mutations.  The patient had previous negative genetic testing, however BART testing was not performed at that time.  We also discussed that other genes can be implicated in hereditary breast and pancreatic cancer families, including PALB2 and ATM.  With the additional colon cancer in the family, there is a possiblility that there could be a pathogenic variant in one of the MMR genes that would cause Lynch syndrome.  We reviewed the characteristics, features and inheritance patterns of hereditary cancer syndromes. We also discussed genetic testing, including the appropriate family members to test, the process of testing, insurance coverage and turn-around-time for results. We discussed the implications of a negative, positive and/or variant of uncertain significant result. We recommended Ms. Kidney pursue genetic testing for the [facility] gene panel. The [facility] gene panel offered by [employer] includes sequencing and deletion/duplication testing of the following 27 genes: APC, ATM, BARD1, BMPR1A, BRCA1, BRCA2, BRIP1, CHD1, CDK4, CDKN2A, CHEK2, EPCAM (large rearrangement only), MLH1, MSH2, MSH6, MUTYH, NBN, PALB2, PMS2, PTEN, RAD51C, RAD51D, SMAD4, STK11, and TP53. Sequencing was performed for select regions of POLE and POLD1, and large rearrangement analysis was performed for select regions of GREM1.   Based on Ms. Holtrop's personal and family history of cancer, she meets medical criteria for genetic testing. Despite that she meets criteria, she may still have an out of pocket cost. We discussed that if her out of pocket cost for testing is over $100, the laboratory will call and confirm whether she wants to proceed with testing.  If the out of pocket cost of testing is less than $100 she will be billed by  the genetic testing laboratory.   PLAN: After considering the risks, benefits, and limitations, Ms. Smigiel  provided informed consent to pursue genetic testing and the blood sample was sent to [facility] for analysis of the [facility] profile. Results should be available within approximately 2-3 weeks' time, at which point they will be disclosed by telephone to Ms. Forsman, as will any additional recommendations warranted by these results. Ms. Prowell will receive a summary of her genetic counseling visit and a copy of her results once available. This information will also be available in Epic. We encouraged Ms. Feazell to remain in contact with cancer genetics annually so that we can continuously update the family history and inform her of any changes in cancer genetics and testing that may be of benefit for her family. Ms. Boxley questions were answered to her satisfaction today. Our contact information was provided should additional questions or concerns arise.  Lastly, we encouraged Ms. Mccravy to remain in contact with cancer genetics annually so that we can  continuously update the family history and inform her of any changes in cancer genetics and testing that may be of benefit for this family.   Ms.  Reily questions were answered to her satisfaction today. Our contact information was provided should additional questions or concerns arise. Thank you for the referral and allowing [place] to share in the care of your patient.   Montague Corella P. Florene Glen, [place], [facility] Certified Genetic Counselor [email or web address] phone: [phone]  The patient was seen for a total of 45 minutes in face-to-face genetic counseling.  This patient was discussed with Drs. Magrinat, Lindi Adie and/or Burr Medico who agrees with the above.    _______________________________________________________________________ For Office Staff:  Number of people involved in session: 1 Was an Intern/ student involved with case: no

## 2015-09-28 ENCOUNTER — Encounter: Payer: Self-pay | Admitting: Genetic Counselor

## 2015-09-28 ENCOUNTER — Ambulatory Visit: Payer: Self-pay | Admitting: Genetic Counselor

## 2015-09-28 ENCOUNTER — Telehealth: Payer: Self-pay | Admitting: Genetic Counselor

## 2015-09-28 DIAGNOSIS — Z1379 Encounter for other screening for genetic and chromosomal anomalies: Secondary | ICD-10-CM | POA: Insufficient documentation

## 2015-09-28 DIAGNOSIS — Z8042 Family history of malignant neoplasm of prostate: Secondary | ICD-10-CM

## 2015-09-28 DIAGNOSIS — Z800 Family history of malignant neoplasm of digestive organs: Secondary | ICD-10-CM

## 2015-09-28 DIAGNOSIS — D0512 Intraductal carcinoma in situ of left breast: Secondary | ICD-10-CM

## 2015-09-28 DIAGNOSIS — Z803 Family history of malignant neoplasm of breast: Secondary | ICD-10-CM

## 2015-09-28 NOTE — Progress Notes (Signed)
HPI: Renee Wilkerson was previously seen in the [facility] due to a personal and family history of cancer and concerns regarding a hereditary predisposition to cancer. Please refer to our prior cancer genetics clinic note for more information regarding Renee Wilkerson's medical, social and family histories, and our assessment and recommendations, at the time. Renee Wilkerson recent genetic test results were disclosed to her, as were recommendations warranted by these results. These results and recommendations are discussed in more detail below.  FAMILY HISTORY:  We obtained a detailed, 4-generation family history.  Significant diagnoses are listed below: Family History  Problem Relation Age of Onset  . Diabetes Mother   . Cancer Father 93    colon cancer  . Breast cancer Sister 87    bilateral breast cancer, 2nd cancer at 60  . Lung cancer Maternal Aunt   . Pancreatic cancer Paternal Aunt   . Prostate cancer Paternal Uncle   . Leukemia Maternal Grandmother   . Breast cancer Paternal Aunt   . Breast cancer Paternal Aunt     dx <50  . Breast cancer Paternal Aunt   . Colon cancer Paternal Aunt   . Breast cancer Paternal Aunt   . Prostate cancer Paternal Uncle   . Breast cancer Cousin   . Breast cancer Cousin     The patient has two sisters and a brother.  One sister had bilateral breast cancer.  Her mother is alive and cancer free.  She had seven siblings, one who had lung cancer.  There is no other reported family history of cancer on the maternal side.  The patient's father had colon cancer in his 9s.  He was one of 14 children.  He had two brothers who had prostate cancer, four sisters with breast cancer, one who also may have had colon cancer, and a sister with pancreatic cancer.  There are several paternal cousins with breast cancer as well.  Patient's maternal ancestors are of Caucasian descent, and paternal ancestors are of Caucasian descent. There is no reported Ashkenazi  Jewish ancestry. There is no known consanguinity.  GENETIC TEST RESULTS: At the time of Renee Wilkerson's visit, we recommended she pursue genetic testing of the [facility] gene panel. The [facility] gene panel offered by [employer] includes sequencing and deletion/duplication testing of the following 27 genes: APC, ATM, BARD1, BMPR1A, BRCA1, BRCA2, BRIP1, CHD1, CDK4, CDKN2A, CHEK2, EPCAM (large rearrangement only), MLH1, MSH2, MSH6, MUTYH, NBN, PALB2, PMS2, PTEN, RAD51C, RAD51D, SMAD4, STK11, and TP53. Sequencing was performed for select regions of POLE and POLD1, and large rearrangement analysis was performed for select regions of GREM1.  The report date is September 25, 2015.  Genetic testing was normal, and did not reveal a deleterious mutation in these genes. The test report has been scanned into EPIC and is located under the Molecular Pathology section of the Results Review tab.   We discussed with Renee Wilkerson that since the current genetic testing is not perfect, it is possible there may be a gene mutation in one of these genes that current testing cannot detect, but that chance is small. We also discussed, that it is possible that another gene that has not yet been discovered, or that we have not yet tested, is responsible for the cancer diagnoses in the family, and it is, therefore, important to remain in touch with cancer genetics in the future so that we can continue to offer Renee Wilkerson the most up to date genetic testing.  CANCER SCREENING RECOMMENDATIONS: Given Renee Wilkerson's personal and family histories, we must interpret these negative results with some caution.  Families with features suggestive of hereditary risk for cancer tend to have multiple family members with cancer, diagnoses in multiple generations and diagnoses before the age of 56. Renee Wilkerson's family exhibits some of these features. Thus this result may simply reflect our current inability to detect all mutations within  these genes or there may be a different gene that has not yet been discovered or tested.   RECOMMENDATIONS FOR FAMILY MEMBERS: Women in this family might be at some increased risk of developing cancer, over the general population risk, simply due to the family history of cancer. We recommended women in this family have a yearly mammogram beginning at age 33, or 39 years younger than the earliest onset of cancer, an an annual clinical breast exam, and perform monthly breast self-exams. Women in this family should also have a gynecological exam as recommended by their primary provider. All family members should have a colonoscopy by age 25.  FOLLOW-UP: Lastly, we discussed with Renee Wilkerson that cancer genetics is a rapidly advancing field and it is possible that new genetic tests will be appropriate for her and/or her family members in the future. We encouraged her to remain in contact with cancer genetics on an annual basis so we can update her personal and family histories and let her know of advances in cancer genetics that may benefit this family.   Our contact number was provided. Renee Wilkerson questions were answered to her satisfaction, and she knows she is welcome to call us at anytime with additional questions or concerns.   Renee Kayser, MS, [facility] Certified Genetic Counselor [email or web address]

## 2015-09-28 NOTE — Telephone Encounter (Signed)
Revealed negative genetic testing on the [facility] test.  Discussed that this does not explain her cancer and that in her family.  There is a chance that there is a mutation we cannot detect or a gene that we are not testing for yet.  We recommend that she keep in contact with genetics to make sure that there is not any additional testing needed.

## 2015-11-27 ENCOUNTER — Other Ambulatory Visit: Payer: Self-pay | Admitting: Internal Medicine

## 2015-11-27 DIAGNOSIS — Z853 Personal history of malignant neoplasm of breast: Secondary | ICD-10-CM

## 2015-11-27 DIAGNOSIS — Z1231 Encounter for screening mammogram for malignant neoplasm of breast: Secondary | ICD-10-CM

## 2015-12-21 ENCOUNTER — Encounter (HOSPITAL_COMMUNITY): Payer: Self-pay

## 2015-12-31 ENCOUNTER — Ambulatory Visit: Payer: Medicare HMO

## 2016-02-06 ENCOUNTER — Ambulatory Visit
Admission: RE | Admit: 2016-02-06 | Discharge: 2016-02-06 | Disposition: A | Discharge status: Home / Self Care | Payer: Medicare HMO | Source: Ambulatory Visit | Attending: Internal Medicine | Admitting: Internal Medicine

## 2016-02-06 DIAGNOSIS — Z1231 Encounter for screening mammogram for malignant neoplasm of breast: Secondary | ICD-10-CM

## 2016-02-06 DIAGNOSIS — Z853 Personal history of malignant neoplasm of breast: Secondary | ICD-10-CM

## 2016-02-14 DIAGNOSIS — Z79899 Other long term (current) drug therapy: Secondary | ICD-10-CM | POA: Diagnosis not present

## 2016-02-26 DIAGNOSIS — M899 Disorder of bone, unspecified: Secondary | ICD-10-CM | POA: Diagnosis not present

## 2016-03-03 DIAGNOSIS — L309 Dermatitis, unspecified: Secondary | ICD-10-CM | POA: Diagnosis not present

## 2016-03-03 DIAGNOSIS — L299 Pruritus, unspecified: Secondary | ICD-10-CM | POA: Diagnosis not present

## 2016-04-10 DIAGNOSIS — F5101 Primary insomnia: Secondary | ICD-10-CM | POA: Diagnosis not present

## 2016-04-10 DIAGNOSIS — R350 Frequency of micturition: Secondary | ICD-10-CM | POA: Diagnosis not present

## 2016-04-10 DIAGNOSIS — M150 Primary generalized (osteo)arthritis: Secondary | ICD-10-CM | POA: Diagnosis not present

## 2016-04-10 DIAGNOSIS — I10 Essential (primary) hypertension: Secondary | ICD-10-CM | POA: Diagnosis not present

## 2016-04-10 DIAGNOSIS — M8589 Other specified disorders of bone density and structure, multiple sites: Secondary | ICD-10-CM | POA: Diagnosis not present

## 2016-04-26 ENCOUNTER — Encounter (HOSPITAL_COMMUNITY): Payer: Self-pay | Admitting: Emergency Medicine

## 2016-04-26 ENCOUNTER — Emergency Department (HOSPITAL_COMMUNITY): Payer: PPO

## 2016-04-26 ENCOUNTER — Emergency Department (HOSPITAL_COMMUNITY)
Admission: EM | Admit: 2016-04-26 | Discharge: 2016-04-26 | Disposition: A | Discharge status: Home / Self Care | Payer: PPO | Attending: Emergency Medicine | Admitting: Emergency Medicine

## 2016-04-26 DIAGNOSIS — N132 Hydronephrosis with renal and ureteral calculous obstruction: Secondary | ICD-10-CM | POA: Diagnosis not present

## 2016-04-26 DIAGNOSIS — Z853 Personal history of malignant neoplasm of breast: Secondary | ICD-10-CM | POA: Insufficient documentation

## 2016-04-26 DIAGNOSIS — I10 Essential (primary) hypertension: Secondary | ICD-10-CM | POA: Insufficient documentation

## 2016-04-26 DIAGNOSIS — R109 Unspecified abdominal pain: Secondary | ICD-10-CM | POA: Diagnosis not present

## 2016-04-26 DIAGNOSIS — N201 Calculus of ureter: Secondary | ICD-10-CM

## 2016-04-26 LAB — CBC WITH DIFFERENTIAL/PLATELET
Basophils Absolute: 0 10*3/uL (ref 0.0–0.1)
Basophils Relative: 0 %
Eosinophils Absolute: 0 10*3/uL (ref 0.0–0.7)
Eosinophils Relative: 0 %
HCT: 44.7 % (ref 36.0–46.0)
Hemoglobin: 14.2 g/dL (ref 12.0–15.0)
Lymphocytes Relative: 19 %
Lymphs Abs: 1.6 10*3/uL (ref 0.7–4.0)
MCH: 30.4 pg (ref 26.0–34.0)
MCHC: 31.8 g/dL (ref 30.0–36.0)
MCV: 95.7 fL (ref 78.0–100.0)
Monocytes Absolute: 0.7 10*3/uL (ref 0.1–1.0)
Monocytes Relative: 9 %
Neutro Abs: 5.9 10*3/uL (ref 1.7–7.7)
Neutrophils Relative %: 72 %
Platelets: 191 10*3/uL (ref 150–400)
RBC: 4.67 MIL/uL (ref 3.87–5.11)
RDW: 13.4 % (ref 11.5–15.5)
WBC: 8.3 10*3/uL (ref 4.0–10.5)

## 2016-04-26 LAB — COMPREHENSIVE METABOLIC PANEL
ALT: 18 U/L (ref 14–54)
AST: 18 U/L (ref 15–41)
Albumin: 3.9 g/dL (ref 3.5–5.0)
Alkaline Phosphatase: 62 U/L (ref 38–126)
Anion gap: 7 (ref 5–15)
BUN: 20 mg/dL (ref 6–20)
CO2: 23 mmol/L (ref 22–32)
Calcium: 8.4 mg/dL — ABNORMAL LOW (ref 8.9–10.3)
Chloride: 111 mmol/L (ref 101–111)
Creatinine, Ser: 0.96 mg/dL (ref 0.44–1.00)
GFR calc Af Amer: >60 mL/min (ref >60)
GFR calc non Af Amer: 59 mL/min — ABNORMAL LOW (ref >60)
Glucose, Bld: 110 mg/dL — ABNORMAL HIGH (ref 65–99)
Potassium: 3.5 mmol/L (ref 3.5–5.1)
Sodium: 141 mmol/L (ref 135–145)
Total Bilirubin: 0.7 mg/dL (ref 0.3–1.2)
Total Protein: 6.4 g/dL — ABNORMAL LOW (ref 6.5–8.1)

## 2016-04-26 LAB — URINALYSIS, ROUTINE W REFLEX MICROSCOPIC
Bilirubin Urine: NEGATIVE
Glucose, UA: NEGATIVE mg/dL
Ketones, ur: NEGATIVE mg/dL
Leukocytes, UA: NEGATIVE
Nitrite: NEGATIVE
Protein, ur: 30 mg/dL — AB
Specific Gravity, Urine: 1.015 (ref 1.005–1.030)
Squamous Epithelial / LPF: NONE SEEN
WBC, UA: NONE SEEN WBC/hpf (ref 0–5)
pH: 5 (ref 5.0–8.0)

## 2016-04-26 MED ORDER — ONDANSETRON HCL 4 MG/2ML IJ SOLN
4.0000 mg | Freq: Once | INTRAMUSCULAR | Status: AC
  Administered 2016-04-26: 4 mg via INTRAVENOUS
  Filled 2016-04-26: qty 2

## 2016-04-26 MED ORDER — HYDROCODONE-ACETAMINOPHEN 5-325 MG PO TABS: 1.0000 | ORAL_TABLET | ORAL | 0 refills | Status: AC | PRN

## 2016-04-26 MED ORDER — HYDROCODONE-ACETAMINOPHEN 5-325 MG PO TABS
1.0000 | ORAL_TABLET | Freq: Once | ORAL | Status: AC
  Administered 2016-04-26: 1 via ORAL
  Filled 2016-04-26: qty 1

## 2016-04-26 MED ORDER — ONDANSETRON HCL 4 MG PO TABS: 4.0000 mg | ORAL_TABLET | Freq: Four times a day (QID) | ORAL | 0 refills | Status: DC

## 2016-04-26 MED ORDER — KETOROLAC TROMETHAMINE 15 MG/ML IJ SOLN
15.0000 mg | Freq: Once | INTRAMUSCULAR | Status: AC
  Administered 2016-04-26: 15 mg via INTRAVENOUS
  Filled 2016-04-26: qty 1

## 2016-04-26 NOTE — ED Provider Notes (Signed)
[facility] DEPT Provider Note   CSN: [id number] Arrival date & time: 04/26/16  1756     History   Chief Complaint Chief Complaint  Patient presents with  . Flank Pain    HPI Renee Wilkerson is a 71 y.o. female.  HPI   71 year old female presents today with acute onset of right flank pain.  Patient had acute onset of symptoms approximately 5 hours prior to arrival.  She notes that sharp in nature in her right flank.  She notes this is identical to previous history of kidney stones.  She notes the pain is been persistent, also notes blood in her urine.  She denies any fever proceeding urine abnormalities.  She reports nausea, denies any vomiting.    Past Medical History:  Diagnosis Date  . Arthritis   . Breast cancer ([place])   . Family history of breast cancer   . Family history of colon cancer   . Family history of pancreatic cancer   . Family history of prostate cancer   . Full dentures   . GERD (gastroesophageal reflux disease)   . Hot flashes   . Hypertension   . Thyroid disease   . Wears glasses   . Yeast infection 12/16/2013    Patient Active Problem List   Diagnosis Date Noted  . Genetic testing 09/28/2015  . Family history of breast cancer   . Family history of pancreatic cancer   . Family history of colon cancer   . Family history of prostate cancer   . Yeast infection 12/16/2013  . 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 Hot flashes     Past Surgical History:  Procedure Laterality Date  . ABDOMINAL HYSTERECTOMY  1980   no ovaries  . BREAST SURGERY  2009   rt lump-radiation-tamoxifin  . COLONOSCOPY    . LITHOTRIPSY    . SHOULDER ARTHROSCOPY WITH SUBACROMIAL DECOMPRESSION AND OPEN ROTATOR C Left 09/22/2013   Procedure: EXAM UNDER ANESTHESIA, MANIPULATION OF SHOULDER, LEFT SHOULDER ARTHROSCOPY DEBRIDEMENT EXTENSIVE, DISTAL CLAVICULECTOMY, LYSIS OF ADHESIONS, DECOMPRESSION SUBACROMIAL PARTIAL ACROMIOPLASTY;  Surgeon: Ninetta Lights, MD;   Location: [facility];  Service: Orthopedics;  Laterality: Left;  . THYROIDECTOMY, PARTIAL  1988  . TONSILLECTOMY      OB History    No data available       Home Medications    Prior to Admission medications   Medication Sig Start Date End Date Taking? Authorizing Provider  fexofenadine (ALLEGRA) 180 MG tablet Take 180 mg by mouth daily. 03/03/16  Yes Historical Provider, MD  fluticasone (FLONASE) 50 MCG/ACT nasal spray Place 1-2 sprays into both nostrils daily. 03/13/16  Yes Historical Provider, MD  losartan (COZAAR) 50 MG tablet Take 50 mg by mouth daily.   Yes Historical Provider, MD  Probiotic Product (ALIGN) 4 MG CAPS Take 4 mg by mouth daily.   Yes Historical Provider, MD  traMADol (ULTRAM) 50 MG tablet Take 50 mg by mouth every 6 (six) hours as needed (pain).   Yes Historical Provider, MD  zolpidem (AMBIEN) 5 MG tablet Take 5 mg by mouth at bedtime.    Yes Historical Provider, MD  HYDROcodone-acetaminophen (NORCO/VICODIN) 5-325 MG tablet Take 1 tablet by mouth every 4 (four) hours as needed. 04/26/16   Dellis Filbert Monya Kozakiewicz, PA-C  ondansetron (ZOFRAN) 4 MG tablet Take 1 tablet (4 mg total) by mouth every 6 (six) hours. 04/26/16   Okey Regal, PA-C    Family History Family History  Problem  Relation Age of Onset  . Diabetes Mother   . Cancer Father 13    colon cancer  . Breast cancer Sister 46    bilateral breast cancer, 2nd cancer at 83  . Lung cancer Maternal Aunt   . Pancreatic cancer Paternal Aunt   . Prostate cancer Paternal Uncle   . Leukemia Maternal Grandmother   . Breast cancer Paternal Aunt   . Breast cancer Paternal Aunt     dx <50  . Breast cancer Paternal Aunt   . Colon cancer Paternal Aunt   . Breast cancer Paternal Aunt   . Prostate cancer Paternal Uncle   . Breast cancer Cousin   . Breast cancer Cousin     Social History Social History  Substance Use Topics  . Smoking status: Never Smoker  . Smokeless tobacco: Never Used  . Alcohol use  No     Allergies   Sulfa antibiotics   Review of Systems Review of Systems  All other systems reviewed and are negative.   Physical Exam Updated Vital Signs BP (!) 185/108 (BP Location: Left Arm)   Pulse 77   Temp 97.9 F (36.6 C) (Oral)   Resp 18   Ht '5\' 2"'$  (1.575 m)   Wt 63 kg   SpO2 99%   BMI 25.42 kg/m   Physical Exam  Constitutional: She is oriented to person, place, and time. She appears well-developed and well-nourished.  HENT:  Head: Normocephalic and atraumatic.  Eyes: Conjunctivae are normal. Pupils are equal, round, and reactive to light. Right eye exhibits no discharge. Left eye exhibits no discharge. No scleral icterus.  Neck: Normal range of motion. No JVD present. No tracheal deviation present.  Pulmonary/Chest: Effort normal. No stridor.  Abdominal: Soft. She exhibits no distension and no mass. There is no tenderness. There is no rebound and no guarding.  No CVA tenderness  Neurological: She is alert and oriented to person, place, and time. Coordination normal.  Psychiatric: She has a normal mood and affect. Her behavior is normal. Judgment and thought content normal.  Nursing note and vitals reviewed.    ED Treatments / Results  Labs (all labs ordered are listed, but only abnormal results are displayed) Labs Reviewed  COMPREHENSIVE METABOLIC PANEL - Abnormal; Notable for the following:       Result Value   Glucose, Bld 110 (*)    Calcium 8.4 (*)    Total Protein 6.4 (*)    GFR calc non Af Amer 59 (*)    All other components within normal limits  URINALYSIS, ROUTINE W REFLEX MICROSCOPIC - Abnormal; Notable for the following:    APPearance CLOUDY (*)    Hgb urine dipstick LARGE (*)    Protein, ur 30 (*)    Bacteria, UA RARE (*)    All other components within normal limits  CBC WITH DIFFERENTIAL/PLATELET    EKG  EKG Interpretation None       Radiology Ct Renal Stone Study  Result Date: 04/26/2016 CLINICAL DATA:  71 year old female  with right-sided flank pain. History of kidney stones. EXAM: CT ABDOMEN AND PELVIS WITHOUT CONTRAST TECHNIQUE: Multidetector CT imaging of the abdomen and pelvis was performed following the standard protocol without IV contrast. COMPARISON:  Abdominal CT dated 09/28/2015 FINDINGS: Evaluation of this exam is limited in the absence of intravenous contrast. Lower chest: The visualized lung bases are clear. No intra-abdominal free air or free fluid. Hepatobiliary: No focal liver abnormality is seen. No gallstones, gallbladder wall thickening, or  biliary dilatation. Pancreas: Unremarkable. No pancreatic ductal dilatation or surrounding inflammatory changes. Spleen: Normal in size without focal abnormality. Adrenals/Urinary Tract: The adrenal glands are unremarkable. There is a 5 mm right ureteropelvic junction stone with mild right hydronephrosis. Multiple other nonobstructing bilateral renal calculi measuring up to 5 mm in the interpolar aspect of the left kidney noted. There is no hydronephrosis on the left. Multiple small bilateral parapelvic cysts noted. The visualized ureters and urinary bladder appear unremarkable. Stomach/Bowel: There is sigmoid diverticulosis without active inflammatory changes. There is no evidence of bowel obstruction or active inflammation. Normal appendix. Vascular/Lymphatic: There is mild aortoiliac atherosclerotic disease. The abdominal aorta and IVC are otherwise grossly unremarkable on this noncontrast study. No portal venous gas identified. There is no adenopathy. Reproductive: Hysterectomy. Other: Small fat containing umbilical hernia Musculoskeletal: There is osteopenia with disc disease at L5-S1. No acute osseous pathology. IMPRESSION: 1. A 5 mm right UPJ stone with mild right hydronephrosis. Multiple other nonobstructing bilateral renal calculi and small bilateral parapelvic cysts. No hydronephrosis on the left. 2. Sigmoid diverticulosis. No bowel obstruction or active inflammation.  Normal appendix. Electronically Signed   By: Anner Crete M.D.   On: 04/26/2016 18:54    Procedures Procedures (including critical care time)  Medications Ordered in ED Medications  HYDROcodone-acetaminophen (NORCO/VICODIN) 5-325 MG per tablet 1 tablet (not administered)  ketorolac (TORADOL) 15 MG/ML injection 15 mg (15 mg Intravenous Given 04/26/16 1814)  ondansetron (ZOFRAN) injection 4 mg (4 mg Intravenous Given 04/26/16 1814)     Initial Impression / Assessment and Plan / ED Course  I have reviewed the triage vital signs and the nursing notes.  Pertinent labs & imaging results that were available during my care of the patient were reviewed by me and considered in my medical decision making (see chart for details).      Final Clinical Impressions(s) / ED Diagnoses   Final diagnoses:  Ureterolithiasis    Labs: CBC, CMP, urinalysis  Imaging: CT renal  Consults:  Therapeutics: Toradol  Discharge Meds: Hydrocodone, Zofran  Assessment/Plan: 71 year old female presents today with kidney stones and ureterolithiasis.  Patients pain managed with Toradol here.  She has no signs of urinary tract infection or systemic illness.  She is currently being seen by [facility].  Patient will be given pain medications as needed for home, close follow-up with [facility] serology, strict return precautions.  She verbalized understanding and agreement to today's plan and had no further questions or concerns at home discharge   New Prescriptions New Prescriptions   HYDROCODONE-ACETAMINOPHEN (NORCO/VICODIN) 5-325 MG TABLET    Take 1 tablet by mouth every 4 (four) hours as needed.   ONDANSETRON (ZOFRAN) 4 MG TABLET    Take 1 tablet (4 mg total) by mouth every 6 (six) hours.     Okey Regal, PA-C 04/26/16 2047    Davonna Belling, MD 04/26/16 [phone]

## 2016-04-26 NOTE — Discharge Instructions (Signed)
Please read attached information. If you experience any new or worsening signs or symptoms please return to the emergency room for evaluation. Please follow-up with your primary care provider or specialist as discussed. Please use medication prescribed only as directed and discontinue taking if you have any concerning signs or symptoms.   °

## 2016-04-26 NOTE — ED Triage Notes (Signed)
Patient here from home with complaints of right sided flank pain. Hx of same. Nausea.

## 2016-04-28 DIAGNOSIS — N200 Calculus of kidney: Secondary | ICD-10-CM | POA: Diagnosis not present

## 2016-05-09 DIAGNOSIS — H6123 Impacted cerumen, bilateral: Secondary | ICD-10-CM | POA: Diagnosis not present

## 2016-05-12 DIAGNOSIS — N200 Calculus of kidney: Secondary | ICD-10-CM | POA: Diagnosis not present

## 2016-05-28 DIAGNOSIS — I10 Essential (primary) hypertension: Secondary | ICD-10-CM | POA: Diagnosis not present

## 2016-05-28 DIAGNOSIS — Z0000 Encounter for general adult medical examination without abnormal findings: Secondary | ICD-10-CM | POA: Diagnosis not present

## 2016-05-28 DIAGNOSIS — J0181 Other acute recurrent sinusitis: Secondary | ICD-10-CM | POA: Diagnosis not present

## 2016-06-25 DIAGNOSIS — E041 Nontoxic single thyroid nodule: Secondary | ICD-10-CM | POA: Diagnosis not present

## 2016-06-26 DIAGNOSIS — E041 Nontoxic single thyroid nodule: Secondary | ICD-10-CM | POA: Diagnosis not present

## 2016-07-08 DIAGNOSIS — E041 Nontoxic single thyroid nodule: Secondary | ICD-10-CM | POA: Diagnosis not present

## 2016-07-28 DIAGNOSIS — Z87898 Personal history of other specified conditions: Secondary | ICD-10-CM | POA: Diagnosis not present

## 2016-07-28 DIAGNOSIS — W57XXXA Bitten or stung by nonvenomous insect and other nonvenomous arthropods, initial encounter: Secondary | ICD-10-CM | POA: Diagnosis not present

## 2016-07-28 DIAGNOSIS — L509 Urticaria, unspecified: Secondary | ICD-10-CM | POA: Diagnosis not present

## 2016-09-04 DIAGNOSIS — M5442 Lumbago with sciatica, left side: Secondary | ICD-10-CM | POA: Diagnosis not present

## 2016-09-10 DIAGNOSIS — M5442 Lumbago with sciatica, left side: Secondary | ICD-10-CM | POA: Diagnosis not present

## 2016-09-16 DIAGNOSIS — M5442 Lumbago with sciatica, left side: Secondary | ICD-10-CM | POA: Diagnosis not present

## 2016-10-23 DIAGNOSIS — M545 Low back pain: Secondary | ICD-10-CM | POA: Diagnosis not present

## 2016-11-03 DIAGNOSIS — H25813 Combined forms of age-related cataract, bilateral: Secondary | ICD-10-CM | POA: Diagnosis not present

## 2016-11-11 DIAGNOSIS — M5442 Lumbago with sciatica, left side: Secondary | ICD-10-CM | POA: Diagnosis not present

## 2016-12-01 DIAGNOSIS — Z23 Encounter for immunization: Secondary | ICD-10-CM | POA: Diagnosis not present

## 2016-12-08 DIAGNOSIS — N3000 Acute cystitis without hematuria: Secondary | ICD-10-CM | POA: Diagnosis not present

## 2016-12-08 DIAGNOSIS — N3090 Cystitis, unspecified without hematuria: Secondary | ICD-10-CM | POA: Diagnosis not present

## 2017-01-30 DIAGNOSIS — J301 Allergic rhinitis due to pollen: Secondary | ICD-10-CM | POA: Diagnosis not present

## 2017-02-05 DIAGNOSIS — J00 Acute nasopharyngitis [common cold]: Secondary | ICD-10-CM | POA: Diagnosis not present

## 2017-02-05 DIAGNOSIS — R05 Cough: Secondary | ICD-10-CM | POA: Diagnosis not present

## 2017-02-05 DIAGNOSIS — J209 Acute bronchitis, unspecified: Secondary | ICD-10-CM | POA: Diagnosis not present

## 2017-02-09 ENCOUNTER — Other Ambulatory Visit: Payer: Self-pay | Admitting: Internal Medicine

## 2017-02-09 DIAGNOSIS — N200 Calculus of kidney: Secondary | ICD-10-CM | POA: Diagnosis not present

## 2017-02-09 DIAGNOSIS — Z1231 Encounter for screening mammogram for malignant neoplasm of breast: Secondary | ICD-10-CM

## 2017-02-13 DIAGNOSIS — N644 Mastodynia: Secondary | ICD-10-CM | POA: Diagnosis not present

## 2017-02-13 DIAGNOSIS — R922 Inconclusive mammogram: Secondary | ICD-10-CM | POA: Diagnosis not present

## 2017-03-03 DIAGNOSIS — R5381 Other malaise: Secondary | ICD-10-CM | POA: Diagnosis not present

## 2017-03-03 DIAGNOSIS — I10 Essential (primary) hypertension: Secondary | ICD-10-CM | POA: Diagnosis not present

## 2017-03-03 DIAGNOSIS — Z79899 Other long term (current) drug therapy: Secondary | ICD-10-CM | POA: Diagnosis not present

## 2017-03-03 DIAGNOSIS — D0510 Intraductal carcinoma in situ of unspecified breast: Secondary | ICD-10-CM | POA: Diagnosis not present

## 2017-03-03 DIAGNOSIS — E538 Deficiency of other specified B group vitamins: Secondary | ICD-10-CM | POA: Diagnosis not present

## 2017-03-03 DIAGNOSIS — E781 Pure hyperglyceridemia: Secondary | ICD-10-CM | POA: Diagnosis not present

## 2017-03-03 DIAGNOSIS — R7303 Prediabetes: Secondary | ICD-10-CM | POA: Diagnosis not present

## 2017-03-03 DIAGNOSIS — R5383 Other fatigue: Secondary | ICD-10-CM | POA: Diagnosis not present

## 2017-04-07 DIAGNOSIS — E538 Deficiency of other specified B group vitamins: Secondary | ICD-10-CM | POA: Diagnosis not present

## 2017-05-25 DIAGNOSIS — R1084 Generalized abdominal pain: Secondary | ICD-10-CM | POA: Diagnosis not present

## 2017-05-25 DIAGNOSIS — N200 Calculus of kidney: Secondary | ICD-10-CM | POA: Diagnosis not present

## 2017-06-02 ENCOUNTER — Other Ambulatory Visit: Payer: Self-pay | Admitting: Urology

## 2017-06-02 ENCOUNTER — Encounter (HOSPITAL_COMMUNITY): Payer: Self-pay | Admitting: *Deleted

## 2017-06-02 DIAGNOSIS — N202 Calculus of kidney with calculus of ureter: Secondary | ICD-10-CM | POA: Diagnosis not present

## 2017-06-02 DIAGNOSIS — N200 Calculus of kidney: Secondary | ICD-10-CM | POA: Diagnosis not present

## 2017-06-02 DIAGNOSIS — R1084 Generalized abdominal pain: Secondary | ICD-10-CM | POA: Diagnosis not present

## 2017-06-04 ENCOUNTER — Encounter (HOSPITAL_COMMUNITY): Payer: Self-pay | Admitting: *Deleted

## 2017-06-04 ENCOUNTER — Ambulatory Visit (HOSPITAL_COMMUNITY)
Admission: RE | Admit: 2017-06-04 | Discharge: 2017-06-04 | Disposition: A | Discharge status: Home / Self Care | Payer: PPO | Source: Ambulatory Visit | Attending: Urology | Admitting: Urology

## 2017-06-04 ENCOUNTER — Ambulatory Visit (HOSPITAL_COMMUNITY): Payer: PPO

## 2017-06-04 ENCOUNTER — Encounter (HOSPITAL_COMMUNITY)
Admission: RE | Disposition: A | Discharge status: Home / Self Care | Payer: Self-pay | Source: Ambulatory Visit | Attending: Urology

## 2017-06-04 DIAGNOSIS — N200 Calculus of kidney: Secondary | ICD-10-CM | POA: Diagnosis not present

## 2017-06-04 DIAGNOSIS — N201 Calculus of ureter: Secondary | ICD-10-CM | POA: Diagnosis not present

## 2017-06-04 DIAGNOSIS — Z853 Personal history of malignant neoplasm of breast: Secondary | ICD-10-CM | POA: Insufficient documentation

## 2017-06-04 DIAGNOSIS — I10 Essential (primary) hypertension: Secondary | ICD-10-CM | POA: Insufficient documentation

## 2017-06-04 DIAGNOSIS — Z7951 Long term (current) use of inhaled steroids: Secondary | ICD-10-CM | POA: Diagnosis not present

## 2017-06-04 DIAGNOSIS — Z79899 Other long term (current) drug therapy: Secondary | ICD-10-CM | POA: Insufficient documentation

## 2017-06-04 HISTORY — PX: EXTRACORPOREAL SHOCK WAVE LITHOTRIPSY: SHX1557

## 2017-06-04 SURGERY — LITHOTRIPSY, ESWL
Anesthesia: LOCAL | Laterality: Left

## 2017-06-04 MED ORDER — CIPROFLOXACIN HCL 500 MG PO TABS
500.0000 mg | ORAL_TABLET | ORAL | Status: AC
  Administered 2017-06-04: 500 mg via ORAL
  Filled 2017-06-04: qty 1

## 2017-06-04 MED ORDER — ONDANSETRON HCL 4 MG PO TABS: 4.0000 mg | ORAL_TABLET | Freq: Three times a day (TID) | ORAL | 0 refills | Status: DC | PRN

## 2017-06-04 MED ORDER — SODIUM CHLORIDE 0.9 % IV SOLN
INTRAVENOUS | Status: DC
  Administered 2017-06-04: 09:00:00 via INTRAVENOUS

## 2017-06-04 MED ORDER — DIAZEPAM 5 MG PO TABS
10.0000 mg | ORAL_TABLET | ORAL | Status: AC
  Administered 2017-06-04: 10 mg via ORAL
  Filled 2017-06-04: qty 2

## 2017-06-04 MED ORDER — DIPHENHYDRAMINE HCL 25 MG PO CAPS
25.0000 mg | ORAL_CAPSULE | ORAL | Status: AC
  Administered 2017-06-04: 25 mg via ORAL
  Filled 2017-06-04: qty 1

## 2017-06-04 NOTE — Brief Op Note (Signed)
06/04/2017  11:05 AM  PATIENT:  Augustin Coupe Potenza  72 y.o. female  PRE-OPERATIVE DIAGNOSIS:  LEFT URETERAL CALCULUS  POST-OPERATIVE DIAGNOSIS:  * No post-op diagnosis entered *  PROCEDURE:  Procedure(s) with comments: LEFT EXTRACORPOREAL SHOCK WAVE LITHOTRIPSY (ESWL) (Left) - [phone]  Jed Limerick [id number]  SURGEON:  Surgeon(s) and Role:    * Alexis Frock, MD - Primary  PHYSICIAN ASSISTANT:   ASSISTANTS: none   ANESTHESIA:   MAC  EBL:  minimal   BLOOD ADMINISTERED:none  DRAINS: none   LOCAL MEDICATIONS USED:  NONE  SPECIMEN:  No Specimen  DISPOSITION OF SPECIMEN:  N/A  COUNTS:  YES  TOURNIQUET:  * No tourniquets in log *  DICTATION: .Note written in paper chart  PLAN OF CARE: Discharge to home after PACU  PATIENT DISPOSITION:  Short Stay   Delay start of Pharmacological VTE agent (>24hrs) due to surgical blood loss or risk of bleeding: not applicable

## 2017-06-04 NOTE — H&P (Signed)
Renee Wilkerson is an 72 y.o. female.    Chief Complaint: Pre-op LEFT Shockwave Lithotripsy  HPI:   1 - LEFT Ureteral Stone - 6x3mm prox ureteral stone by CT and KUB 05/2017 on eval flank pain. Stone is 52mm, 500HU, 10cm SSD and just lateral to L3 transverse process.  UA without infectious parameters.   Today " Renee Wilkerson " is seen to proceed with shockwave lithotripsy.   Past Medical History:  Diagnosis Date  . Arthritis   . Breast cancer ([place])    right side - lumpectomy  . Family history of breast cancer   . Family history of colon cancer   . Family history of pancreatic cancer   . Family history of prostate cancer   . Full dentures   . GERD (gastroesophageal reflux disease)   . Hot flashes   . Hypertension   . Thyroid disease   . Wears glasses   . Yeast infection 12/16/2013    Past Surgical History:  Procedure Laterality Date  . ABDOMINAL HYSTERECTOMY  1980   no ovaries  . BREAST SURGERY  2009   rt lump-radiation-tamoxifin  . COLONOSCOPY    . LITHOTRIPSY    . SHOULDER ARTHROSCOPY WITH SUBACROMIAL DECOMPRESSION AND OPEN ROTATOR C Left 09/22/2013   Procedure: EXAM UNDER ANESTHESIA, MANIPULATION OF SHOULDER, LEFT SHOULDER ARTHROSCOPY DEBRIDEMENT EXTENSIVE, DISTAL CLAVICULECTOMY, LYSIS OF ADHESIONS, DECOMPRESSION SUBACROMIAL PARTIAL ACROMIOPLASTY;  Surgeon: Ninetta Lights, MD;  Location: [facility];  Service: Orthopedics;  Laterality: Left;  . THYROIDECTOMY, PARTIAL  1988  . TONSILLECTOMY      Family History  Problem Relation Age of Onset  . Diabetes Mother   . Cancer Father 92       colon cancer  . Breast cancer Sister 20       bilateral breast cancer, 2nd cancer at 69  . Lung cancer Maternal Aunt   . Pancreatic cancer Paternal Aunt   . Prostate cancer Paternal Uncle   . Leukemia Maternal Grandmother   . Breast cancer Paternal Aunt   . Breast cancer Paternal Aunt        dx <50  . Breast cancer Paternal Aunt   . Colon cancer Paternal Aunt   . Breast  cancer Paternal Aunt   . Prostate cancer Paternal Uncle   . Breast cancer Cousin   . Breast cancer Cousin    Social History:  reports that she has never smoked. She has never used smokeless tobacco. She reports that she does not drink alcohol or use drugs.  Allergies:  Allergies  Allergen Reactions  . Sulfa Antibiotics Diarrhea, Nausea And Vomiting and Rash    Medications Prior to Admission  Medication Sig Dispense Refill  . fexofenadine (ALLEGRA) 180 MG tablet Take 180 mg by mouth daily.    Marland Kitchen HYDROcodone-acetaminophen (NORCO/VICODIN) 5-325 MG tablet Take 1 tablet by mouth every 4 (four) hours as needed. (Patient taking differently: Take 1 tablet by mouth every 4 (four) hours as needed. ) 15 tablet 0  . losartan (COZAAR) 50 MG tablet Take 50 mg by mouth daily.    . magnesium oxide (MAG-OX) 400 MG tablet Take 400 mg by mouth daily.    . traMADol (ULTRAM) 50 MG tablet Take 50 mg by mouth every 6 (six) hours as needed (pain).    . vitamin B-12 (CYANOCOBALAMIN) 1000 MCG tablet Take 1,000 mcg by mouth daily.    Marland Kitchen zolpidem (AMBIEN) 5 MG tablet Take 5 mg by mouth at bedtime.     Marland Kitchen  fluticasone (FLONASE) 50 MCG/ACT nasal spray Place 1-2 sprays into both nostrils daily.    . ondansetron (ZOFRAN) 4 MG tablet Take 1 tablet (4 mg total) by mouth every 6 (six) hours. 12 tablet 0  . Probiotic Product (ALIGN) 4 MG CAPS Take 4 mg by mouth daily.      No results found for this or any previous visit (from the past 48 hour(s)). No results found.  Review of Systems  Constitutional: Negative.  Negative for chills and fever.  HENT: Negative.   Eyes: Negative.   Respiratory: Negative.   Cardiovascular: Negative.   Gastrointestinal: Negative.   Genitourinary: Positive for flank pain.  Skin: Negative.   Neurological: Negative.   Endo/Heme/Allergies: Negative.   Psychiatric/Behavioral: Negative.     Blood pressure (!) 153/80, pulse 83, temperature 97.7 F (36.5 C), temperature source Oral, resp.  rate 18, height 5\' 2"  (1.575 m), weight 61.2 kg (135 lb), SpO2 98 %. Physical Exam  Constitutional: She appears well-developed.  HENT:  Head: Normocephalic.  Eyes: Pupils are equal, round, and reactive to light.  Neck: Normal range of motion.  Cardiovascular: Normal rate.  Respiratory: Effort normal.  GI: Soft.  Genitourinary:  Genitourinary Comments: Mild left CVAT at present.   Musculoskeletal: Normal range of motion.  Neurological: She is alert.  Skin: Skin is warm.  Psychiatric: She has a normal mood and affect.     Assessment/Plan  1 - LEFT Ureteral Stone - proceed as planned with LEFT SWL. Risks, benefits, alternative expected peri-procedure course discussed previously and reiterated today.   Alexis Frock, MD 06/04/2017, 8:51 AM

## 2017-06-04 NOTE — Discharge Instructions (Signed)
Lithotripsy, Care After °This sheet gives you information about how to care for yourself after your procedure. Your health care provider may also give you more specific instructions. If you have problems or questions, contact your health care provider. °What can I expect after the procedure? °After the procedure, it is common to have: °· Some blood in your urine. This should only last for a few days. °· Soreness in your back, sides, or upper abdomen for a few days. °· Blotches or bruises on your back where the pressure wave entered the skin. °· Pain, discomfort, or nausea when pieces (fragments) of the kidney stone move through the tube that carries urine from the kidney to the bladder (ureter). Stone fragments may pass soon after the procedure, but they may continue to pass for up to 4-8 weeks. °? If you have severe pain or nausea, contact your health care provider. This may be caused by a large stone that was not broken up, and this may mean that you need more treatment. °· Some pain or discomfort during urination. °· Some pain or discomfort in the lower abdomen or (in men) at the base of the penis. ° °Follow these instructions at home: °Medicines °· Take over-the-counter and prescription medicines only as told by your health care provider. °· If you were prescribed an antibiotic medicine, take it as told by your health care provider. Do not stop taking the antibiotic even if you start to feel better. °· Do not drive for 24 hours if you were given a medicine to help you relax (sedative). °· Do not drive or use heavy machinery while taking prescription pain medicine. °Eating and drinking °· Drink enough water and fluids to keep your urine clear or pale yellow. This helps any remaining pieces of the stone to pass. It can also help prevent new stones from forming. °· Eat plenty of fresh fruits and vegetables. °· Follow instructions from your health care provider about eating and drinking restrictions. You may be  instructed: °? To reduce how much salt (sodium) you eat or drink. Check ingredients and nutrition facts on packaged foods and beverages. °? To reduce how much meat you eat. °· Eat the recommended amount of calcium for your age and gender. Ask your health care provider how much calcium you should have. °General instructions °· Get plenty of rest. °· Most people can resume normal activities 1-2 days after the procedure. Ask your health care provider what activities are safe for you. °· If directed, strain all urine through the strainer that was provided by your health care provider. °? Keep all fragments for your health care provider to see. Any stones that are found may be sent to a medical lab for examination. The stone may be as small as a grain of salt. °· Keep all follow-up visits as told by your health care provider. This is important. °Contact a health care provider if: °· You have pain that is severe or does not get better with medicine. °· You have nausea that is severe or does not go away. °· You have blood in your urine longer than your health care provider told you to expect. °· You have more blood in your urine. °· You have pain during urination that does not go away. °· You urinate more frequently than usual and this does not go away. °· You develop a rash or any other possible signs of an allergic reaction. °Get help right away if: °· You have severe pain in   your back, sides, or upper abdomen.  You have severe pain while urinating.  Your urine is very dark red.  You have blood in your stool (feces).  You cannot pass any urine at all.  You feel a strong urge to urinate after emptying your bladder.  You have a fever or chills.  You develop shortness of breath, difficulty breathing, or chest pain.  You have severe nausea that leads to persistent vomiting.  You faint. Summary  After this procedure, it is common to have some pain, discomfort, or nausea when pieces (fragments) of the  kidney stone move through the tube that carries urine from the kidney to the bladder (ureter). If this pain or nausea is severe, however, you should contact your health care provider.  Most people can resume normal activities 1-2 days after the procedure. Ask your health care provider what activities are safe for you.  Drink enough water and fluids to keep your urine clear or pale yellow. This helps any remaining pieces of the stone to pass, and it can help prevent new stones from forming.  If directed, strain your urine and keep all fragments for your health care provider to see. Fragments or stones may be as small as a grain of salt.  Get help right away if you have severe pain in your back, sides, or upper abdomen or have severe pain while urinating. This information is not intended to replace advice given to you by your health care provider. Make sure you discuss any questions you have with your health care provider. Document Released: 02/16/2007 Document Revised: 12/19/2015 Document Reviewed: 12/19/2015 Elsevier Interactive Patient Education  2018 [employer]. Moderate Conscious Sedation, Adult, Care After These instructions provide you with information about caring for yourself after your procedure. Your health care provider may also give you more specific instructions. Your treatment has been planned according to current medical practices, but problems sometimes occur. Call your health care provider if you have any problems or questions after your procedure. What can I expect after the procedure? After your procedure, it is common:  To feel sleepy for several hours.  To feel clumsy and have poor balance for several hours.  To have poor judgment for several hours.  To vomit if you eat too soon.  Follow these instructions at home: For at least 24 hours after the procedure:   Do not: ? Participate in activities where you could fall or become injured. ? Drive. ? Use heavy  machinery. ? Drink alcohol. ? Take sleeping pills or medicines that cause drowsiness. ? Make important decisions or sign legal documents. ? Take care of children on your own.  Rest. Eating and drinking  Follow the diet recommended by your health care provider.  If you vomit: ? Drink water, juice, or soup when you can drink without vomiting. ? Make sure you have little or no nausea before eating solid foods. General instructions  Have a responsible adult stay with you until you are awake and alert.  Take over-the-counter and prescription medicines only as told by your health care provider.  If you smoke, do not smoke without supervision.  Keep all follow-up visits as told by your health care provider. This is important. Contact a health care provider if:  You keep feeling nauseous or you keep vomiting.  You feel light-headed.  You develop a rash.  You have a fever. Get help right away if:  You have trouble breathing. This information is not intended to replace advice  given to you by your health care provider. Make sure you discuss any questions you have with your health care provider. °Document Released: 11/17/2012 Document Revised: 07/02/2015 Document Reviewed: 05/19/2015 °Elsevier Interactive Patient Education © 2018 [employer] ° °

## 2017-06-05 ENCOUNTER — Encounter (HOSPITAL_COMMUNITY): Payer: Self-pay | Admitting: Urology

## 2017-06-18 DIAGNOSIS — R1032 Left lower quadrant pain: Secondary | ICD-10-CM | POA: Diagnosis not present

## 2017-06-18 DIAGNOSIS — N200 Calculus of kidney: Secondary | ICD-10-CM | POA: Diagnosis not present

## 2017-06-25 DIAGNOSIS — E041 Nontoxic single thyroid nodule: Secondary | ICD-10-CM | POA: Diagnosis not present

## 2017-06-30 DIAGNOSIS — E042 Nontoxic multinodular goiter: Secondary | ICD-10-CM | POA: Diagnosis not present

## 2017-06-30 DIAGNOSIS — E041 Nontoxic single thyroid nodule: Secondary | ICD-10-CM | POA: Diagnosis not present

## 2017-07-09 DIAGNOSIS — N200 Calculus of kidney: Secondary | ICD-10-CM | POA: Diagnosis not present

## 2017-08-11 DIAGNOSIS — N200 Calculus of kidney: Secondary | ICD-10-CM | POA: Diagnosis not present

## 2017-08-20 DIAGNOSIS — N200 Calculus of kidney: Secondary | ICD-10-CM | POA: Diagnosis not present

## 2017-08-31 DIAGNOSIS — H25813 Combined forms of age-related cataract, bilateral: Secondary | ICD-10-CM | POA: Diagnosis not present

## 2017-09-01 DIAGNOSIS — K219 Gastro-esophageal reflux disease without esophagitis: Secondary | ICD-10-CM | POA: Diagnosis not present

## 2017-09-01 DIAGNOSIS — M8589 Other specified disorders of bone density and structure, multiple sites: Secondary | ICD-10-CM | POA: Diagnosis not present

## 2017-09-01 DIAGNOSIS — K5904 Chronic idiopathic constipation: Secondary | ICD-10-CM | POA: Diagnosis not present

## 2017-09-01 DIAGNOSIS — I10 Essential (primary) hypertension: Secondary | ICD-10-CM | POA: Diagnosis not present

## 2017-09-01 DIAGNOSIS — Z79899 Other long term (current) drug therapy: Secondary | ICD-10-CM | POA: Diagnosis not present

## 2017-09-01 DIAGNOSIS — E781 Pure hyperglyceridemia: Secondary | ICD-10-CM | POA: Diagnosis not present

## 2017-09-01 DIAGNOSIS — J0181 Other acute recurrent sinusitis: Secondary | ICD-10-CM | POA: Diagnosis not present

## 2017-09-01 DIAGNOSIS — E538 Deficiency of other specified B group vitamins: Secondary | ICD-10-CM | POA: Diagnosis not present

## 2017-09-01 DIAGNOSIS — R5381 Other malaise: Secondary | ICD-10-CM | POA: Diagnosis not present

## 2017-09-01 DIAGNOSIS — R5383 Other fatigue: Secondary | ICD-10-CM | POA: Diagnosis not present

## 2017-09-01 DIAGNOSIS — R7303 Prediabetes: Secondary | ICD-10-CM | POA: Diagnosis not present

## 2017-09-01 DIAGNOSIS — M150 Primary generalized (osteo)arthritis: Secondary | ICD-10-CM | POA: Diagnosis not present

## 2017-09-01 DIAGNOSIS — Z0000 Encounter for general adult medical examination without abnormal findings: Secondary | ICD-10-CM | POA: Diagnosis not present

## 2017-09-01 DIAGNOSIS — E041 Nontoxic single thyroid nodule: Secondary | ICD-10-CM | POA: Diagnosis not present

## 2017-10-10 DIAGNOSIS — R8290 Unspecified abnormal findings in urine: Secondary | ICD-10-CM | POA: Diagnosis not present

## 2017-10-10 DIAGNOSIS — R1032 Left lower quadrant pain: Secondary | ICD-10-CM | POA: Diagnosis not present

## 2017-10-27 DIAGNOSIS — J40 Bronchitis, not specified as acute or chronic: Secondary | ICD-10-CM | POA: Diagnosis not present

## 2017-10-27 DIAGNOSIS — I10 Essential (primary) hypertension: Secondary | ICD-10-CM | POA: Diagnosis not present

## 2017-10-27 DIAGNOSIS — M720 Palmar fascial fibromatosis [Dupuytren]: Secondary | ICD-10-CM | POA: Diagnosis not present

## 2017-10-27 DIAGNOSIS — H6981 Other specified disorders of Eustachian tube, right ear: Secondary | ICD-10-CM | POA: Diagnosis not present

## 2017-10-27 DIAGNOSIS — J0181 Other acute recurrent sinusitis: Secondary | ICD-10-CM | POA: Diagnosis not present

## 2017-12-22 DIAGNOSIS — Z23 Encounter for immunization: Secondary | ICD-10-CM | POA: Diagnosis not present

## 2018-02-04 DIAGNOSIS — J069 Acute upper respiratory infection, unspecified: Secondary | ICD-10-CM | POA: Diagnosis not present

## 2018-02-04 DIAGNOSIS — R05 Cough: Secondary | ICD-10-CM | POA: Diagnosis not present

## 2018-03-01 DIAGNOSIS — J069 Acute upper respiratory infection, unspecified: Secondary | ICD-10-CM | POA: Diagnosis not present

## 2018-03-04 DIAGNOSIS — R7303 Prediabetes: Secondary | ICD-10-CM | POA: Diagnosis not present

## 2018-03-04 DIAGNOSIS — J069 Acute upper respiratory infection, unspecified: Secondary | ICD-10-CM | POA: Diagnosis not present

## 2018-03-04 DIAGNOSIS — E781 Pure hyperglyceridemia: Secondary | ICD-10-CM | POA: Diagnosis not present

## 2018-03-04 DIAGNOSIS — Z79899 Other long term (current) drug therapy: Secondary | ICD-10-CM | POA: Diagnosis not present

## 2018-03-04 DIAGNOSIS — N183 Chronic kidney disease, stage 3 (moderate): Secondary | ICD-10-CM | POA: Diagnosis not present

## 2018-03-04 DIAGNOSIS — K7581 Nonalcoholic steatohepatitis (NASH): Secondary | ICD-10-CM | POA: Diagnosis not present

## 2018-03-04 DIAGNOSIS — E538 Deficiency of other specified B group vitamins: Secondary | ICD-10-CM | POA: Diagnosis not present

## 2018-03-04 DIAGNOSIS — I129 Hypertensive chronic kidney disease with stage 1 through stage 4 chronic kidney disease, or unspecified chronic kidney disease: Secondary | ICD-10-CM | POA: Diagnosis not present

## 2018-03-04 DIAGNOSIS — F5101 Primary insomnia: Secondary | ICD-10-CM | POA: Diagnosis not present

## 2018-03-04 DIAGNOSIS — I700 Atherosclerosis of aorta: Secondary | ICD-10-CM | POA: Diagnosis not present

## 2018-03-04 DIAGNOSIS — R5381 Other malaise: Secondary | ICD-10-CM | POA: Diagnosis not present

## 2018-03-04 DIAGNOSIS — R5383 Other fatigue: Secondary | ICD-10-CM | POA: Diagnosis not present

## 2018-06-17 DIAGNOSIS — N200 Calculus of kidney: Secondary | ICD-10-CM | POA: Diagnosis not present

## 2018-06-17 DIAGNOSIS — R109 Unspecified abdominal pain: Secondary | ICD-10-CM | POA: Diagnosis not present

## 2018-06-17 DIAGNOSIS — R1084 Generalized abdominal pain: Secondary | ICD-10-CM | POA: Diagnosis not present

## 2018-06-17 DIAGNOSIS — R310 Gross hematuria: Secondary | ICD-10-CM | POA: Diagnosis not present

## 2018-06-26 IMAGING — CT CT RENAL STONE PROTOCOL
2 of 3 series · 16 of 46 positions shown, 18 images · non-contrast
Comparison: Abdominal CT dated 09/28/2015

CLINICAL DATA: 70-year-old female with right-sided flank pain.
History of kidney stones.

EXAM:
CT ABDOMEN AND PELVIS WITHOUT CONTRAST
TECHNIQUE: Multidetector CT imaging of the abdomen and pelvis was performed
following the standard protocol without IV contrast.

[Series 3: coronal · coronal · 0.74mm/px · 3 of 143 slices shown]
[im 48/143  soft-tissue]
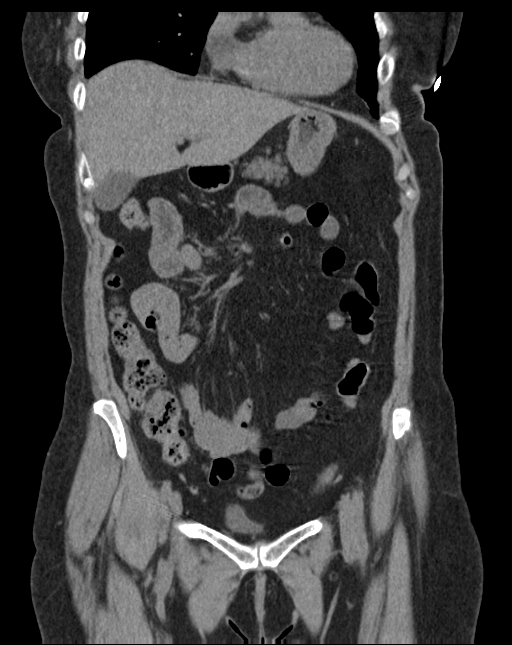
[im 64/143  soft-tissue]
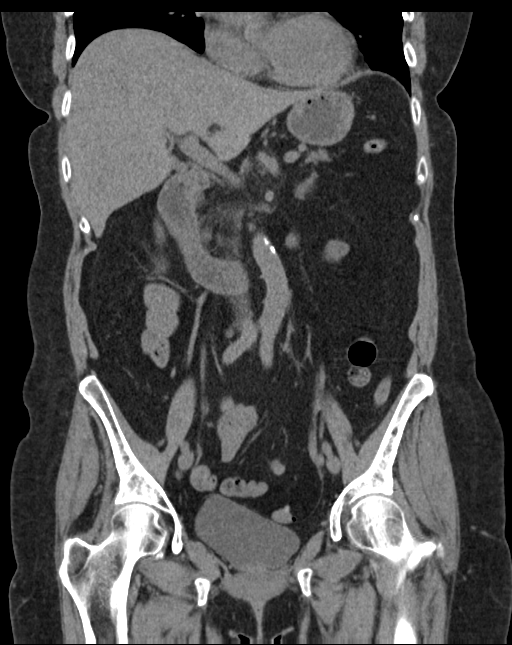
[im 79/143  soft-tissue]
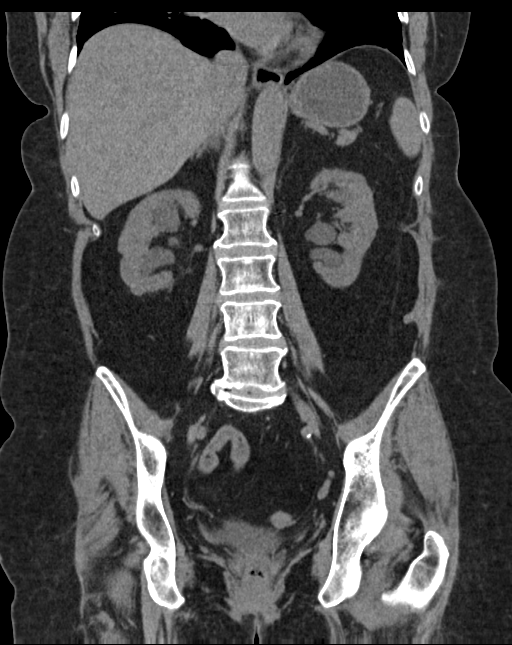

[Series 6: lung · axial · 0.77mm/px · z∈[-210,-122]mm · 13 of 52 slices shown, 15 images]
[im 4/52  soft-tissue]
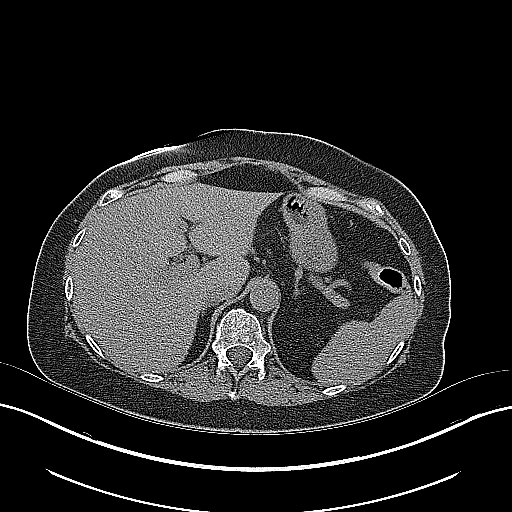
[im 4/52  bone]
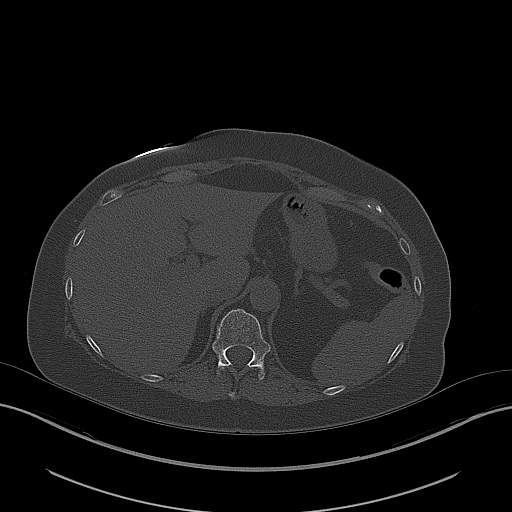
[im 7/52  soft-tissue]
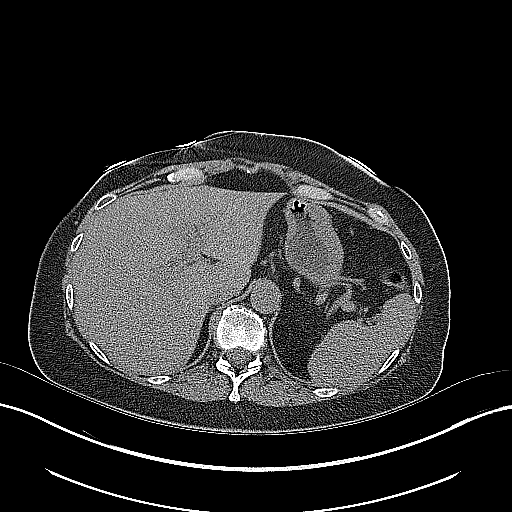
[im 10/52  soft-tissue]
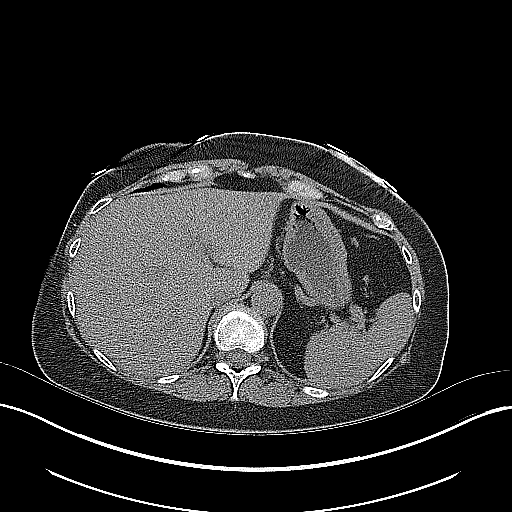
[im 15/52  soft-tissue]
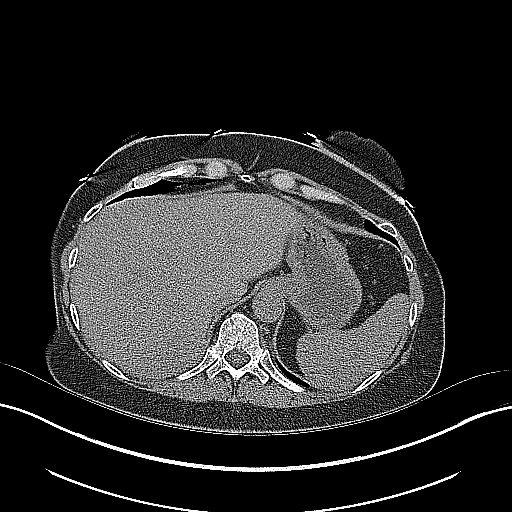
[im 19/52  soft-tissue]
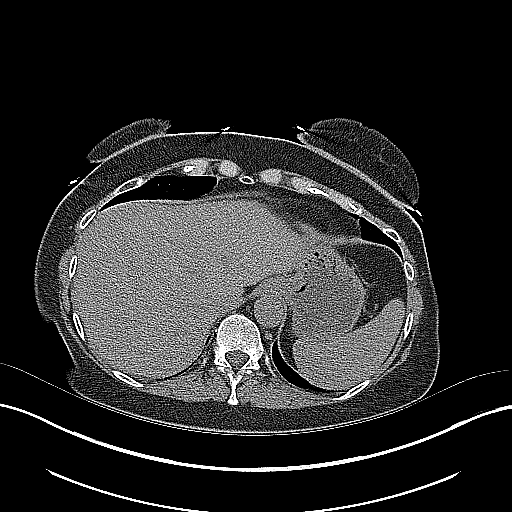
[im 22/52  soft-tissue]
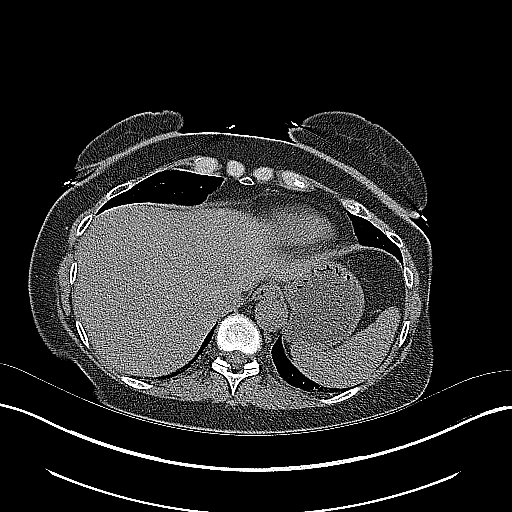
[im 27/52  soft-tissue]
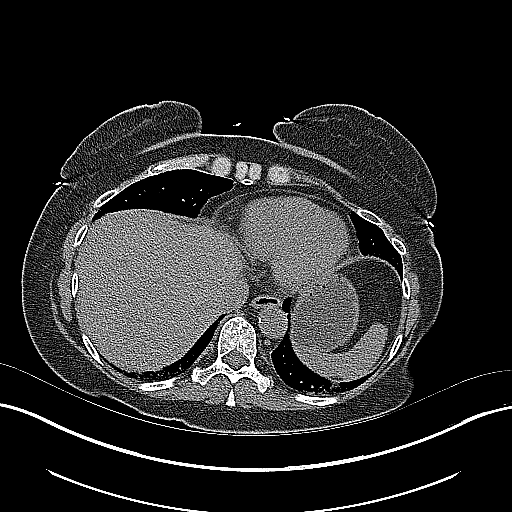
[im 30/52  soft-tissue]
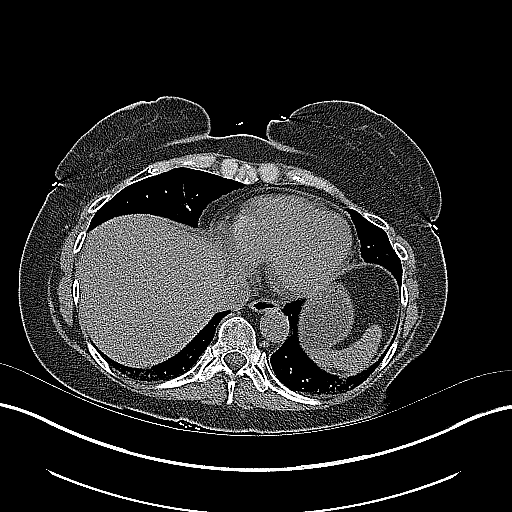
[im 33/52  soft-tissue]
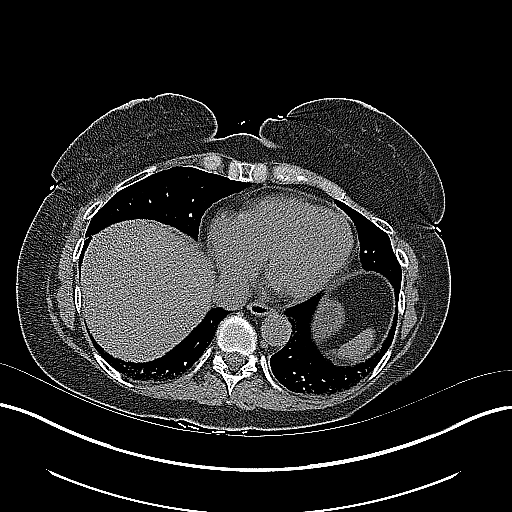
[im 33/52  bone]
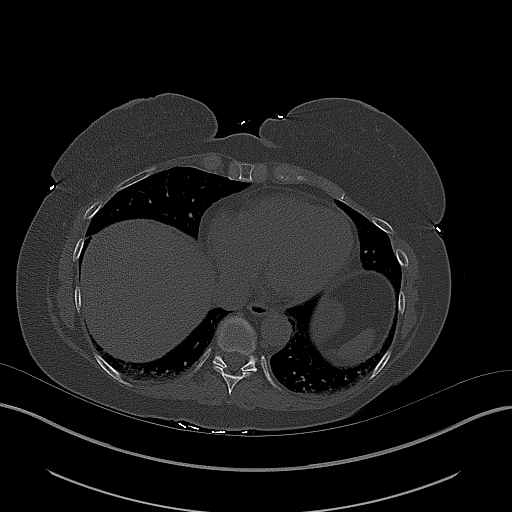
[im 37/52  soft-tissue]
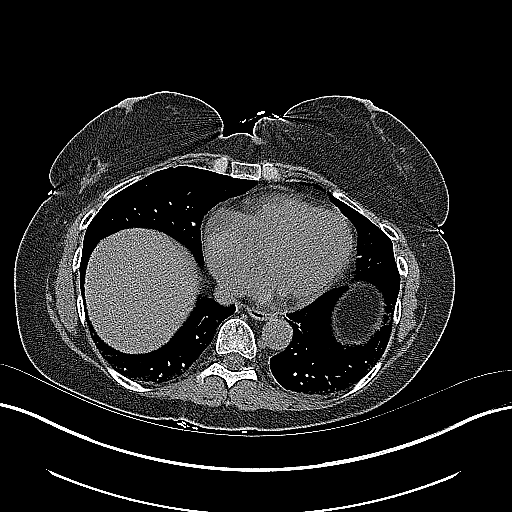
[im 42/52  soft-tissue]
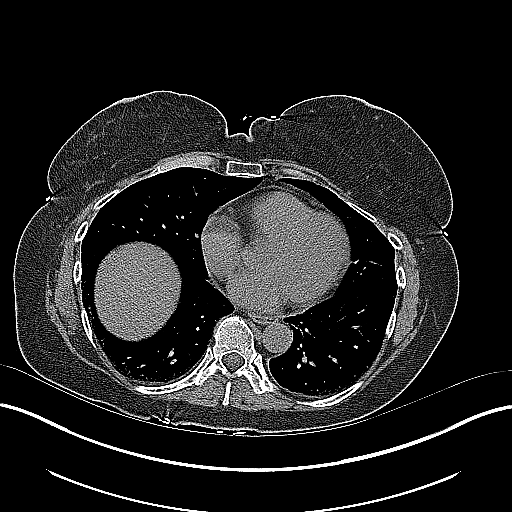
[im 45/52  soft-tissue]
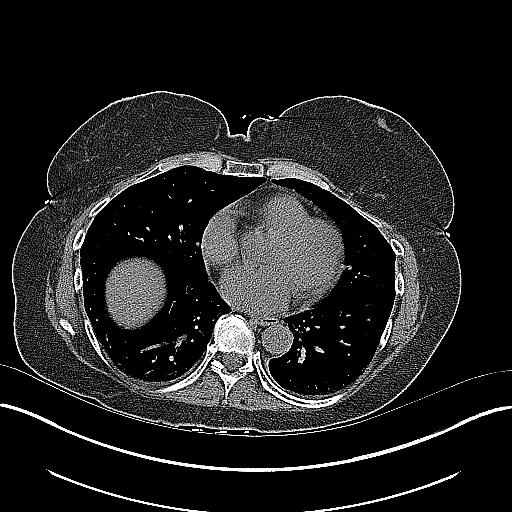
[im 48/52  soft-tissue]
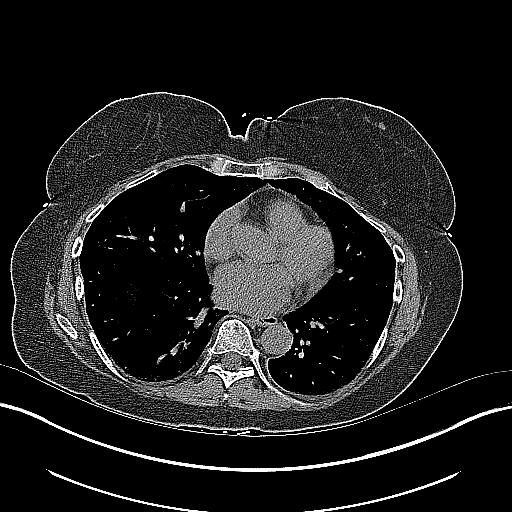

[16 of 46 positions shown; findings below may reference images not displayed]

FINDINGS: Evaluation of this exam is limited in the absence of intravenous
contrast.

Lower chest: The visualized lung bases are clear.

No intra-abdominal free air or free fluid.

Hepatobiliary: No focal liver abnormality is seen. No gallstones,
gallbladder wall thickening, or biliary dilatation.

Pancreas: Unremarkable. No pancreatic ductal dilatation or
surrounding inflammatory changes.

Spleen: Normal in size without focal abnormality.

Adrenals/Urinary Tract: The adrenal glands are unremarkable. There
is a 5 mm right ureteropelvic junction stone with mild right
hydronephrosis. Multiple other nonobstructing bilateral renal
calculi measuring up to 5 mm in the interpolar aspect of the left
kidney noted. There is no hydronephrosis on the left. Multiple small
bilateral parapelvic cysts noted. The visualized ureters and urinary
bladder appear unremarkable.

Stomach/Bowel: There is sigmoid diverticulosis without active
inflammatory changes. There is no evidence of bowel obstruction or
active inflammation. Normal appendix.

Vascular/Lymphatic: There is mild aortoiliac atherosclerotic
disease. The abdominal aorta and IVC are otherwise grossly
unremarkable on this noncontrast study. No portal venous gas
identified. There is no adenopathy.

Reproductive: Hysterectomy.

Other: Small fat containing umbilical hernia

Musculoskeletal: There is osteopenia with disc disease at L5-S1. No
acute osseous pathology.
IMPRESSION: 1. A 5 mm right UPJ stone with mild right hydronephrosis. Multiple
other nonobstructing bilateral renal calculi and small bilateral
parapelvic cysts. No hydronephrosis on the left.
2. Sigmoid diverticulosis. No bowel obstruction or active
inflammation. Normal appendix.

## 2018-06-29 DIAGNOSIS — E041 Nontoxic single thyroid nodule: Secondary | ICD-10-CM | POA: Diagnosis not present

## 2018-06-29 DIAGNOSIS — N200 Calculus of kidney: Secondary | ICD-10-CM | POA: Diagnosis not present

## 2018-07-02 ENCOUNTER — Encounter: Payer: Self-pay | Admitting: Gastroenterology

## 2018-07-06 DIAGNOSIS — R1032 Left lower quadrant pain: Secondary | ICD-10-CM | POA: Diagnosis not present

## 2018-07-06 DIAGNOSIS — N200 Calculus of kidney: Secondary | ICD-10-CM | POA: Diagnosis not present

## 2018-07-06 DIAGNOSIS — R310 Gross hematuria: Secondary | ICD-10-CM | POA: Diagnosis not present

## 2018-07-07 DIAGNOSIS — E041 Nontoxic single thyroid nodule: Secondary | ICD-10-CM | POA: Diagnosis not present

## 2018-07-12 DIAGNOSIS — N200 Calculus of kidney: Secondary | ICD-10-CM | POA: Diagnosis not present

## 2018-07-12 DIAGNOSIS — R310 Gross hematuria: Secondary | ICD-10-CM | POA: Diagnosis not present

## 2018-07-13 DIAGNOSIS — J189 Pneumonia, unspecified organism: Secondary | ICD-10-CM | POA: Diagnosis not present

## 2018-07-13 DIAGNOSIS — Z03818 Encounter for observation for suspected exposure to other biological agents ruled out: Secondary | ICD-10-CM | POA: Diagnosis not present

## 2018-07-13 DIAGNOSIS — N183 Chronic kidney disease, stage 3 (moderate): Secondary | ICD-10-CM | POA: Diagnosis not present

## 2018-07-14 ENCOUNTER — Ambulatory Visit: Payer: PPO | Admitting: *Deleted

## 2018-07-14 ENCOUNTER — Other Ambulatory Visit: Payer: Self-pay

## 2018-07-14 ENCOUNTER — Encounter: Payer: Self-pay | Admitting: Gastroenterology

## 2018-07-14 VITALS — Ht 62.0 in | Wt 142.0 lb

## 2018-07-14 DIAGNOSIS — Z800 Family history of malignant neoplasm of digestive organs: Secondary | ICD-10-CM

## 2018-07-14 NOTE — Progress Notes (Signed)
No egg or soy allergy known to patient  No issues with past sedation with any surgeries  or procedures, no intubation problems  No diet pills per patient No home 02 use per patient  No blood thinners per patient  Pt denies issues with constipation  No A fib or A flutter  EMMI video sent to pt's e mail  Pt verified name, DOB, address and insurance during PV today. Pt mailed instruction packet to included paper to complete and mail back to [facility] with addressed and stamped envelope, Emmi video, copy of consent form to read and not return, and instructions.  PV completed over the phone. Pt encouraged to call with questions or issues

## 2018-07-20 ENCOUNTER — Telehealth: Payer: Self-pay | Admitting: Gastroenterology

## 2018-07-20 NOTE — Telephone Encounter (Signed)
  Pt asked about another prep- she's worried about getting this down- Explained to her to drink it slow, use a straw, we discussed chasing with coke or broth, informed her she can start an hour earlier than instructions to give her more time,  I instructed her what to do if she feels nauseated drinking the prep- pt asked about taking medicine for constipation if she gets that way before- instructed her to use OTC colace, or dulcolax or Miralax daily the 5 days before and then follow prep instructions- Pt verbalized understanding-

## 2018-07-20 NOTE — Telephone Encounter (Signed)
Patient is scheduled for a procedure on 6-15 and said that her last experience with the prep did not go to well and would like to know if she can take something else.

## 2018-07-21 DIAGNOSIS — J189 Pneumonia, unspecified organism: Secondary | ICD-10-CM | POA: Diagnosis not present

## 2018-07-21 DIAGNOSIS — R918 Other nonspecific abnormal finding of lung field: Secondary | ICD-10-CM | POA: Diagnosis not present

## 2018-07-23 ENCOUNTER — Telehealth: Payer: Self-pay

## 2018-07-23 NOTE — Telephone Encounter (Signed)
Covid-19 screening questions  Have you traveled in the last 14 days? No. If yes where?  Do you now or have you had a fever in the last 14 days? No.  Do you have any respiratory symptoms of shortness of breath or cough now or in the last 14 days? No.  Do you have any family members or close contacts with diagnosed or suspected Covid-19 in the past 14 days? No.  Have you been tested for Covid-19 and found to be positive? No.       

## 2018-07-26 ENCOUNTER — Ambulatory Visit (AMBULATORY_SURGERY_CENTER): Payer: PPO | Admitting: Gastroenterology

## 2018-07-26 ENCOUNTER — Other Ambulatory Visit: Payer: Self-pay

## 2018-07-26 ENCOUNTER — Encounter: Payer: Self-pay | Admitting: Gastroenterology

## 2018-07-26 VITALS — BP 144/87 | HR 68 | Temp 97.9°F | Resp 14 | Ht 62.0 in | Wt 133.0 lb

## 2018-07-26 DIAGNOSIS — Z1211 Encounter for screening for malignant neoplasm of colon: Secondary | ICD-10-CM | POA: Diagnosis not present

## 2018-07-26 DIAGNOSIS — D120 Benign neoplasm of cecum: Secondary | ICD-10-CM

## 2018-07-26 DIAGNOSIS — D124 Benign neoplasm of descending colon: Secondary | ICD-10-CM | POA: Diagnosis not present

## 2018-07-26 DIAGNOSIS — Z800 Family history of malignant neoplasm of digestive organs: Secondary | ICD-10-CM | POA: Diagnosis not present

## 2018-07-26 MED ORDER — SODIUM CHLORIDE 0.9 % IV SOLN: 500.0000 mL | INTRAVENOUS | Status: DC

## 2018-07-26 NOTE — Op Note (Signed)
[facility] Patient Name: Renee Wilkerson Procedure Date: 07/26/2018 8:33 AM MRN: [id number] Endoscopist: Jackquline Denmark , MD Age: 73 Referring MD:  Date of Birth: 1946/01/18 Gender: Female Account #: [id number] Procedure:                Colonoscopy Indications:              Screening in patient at increased risk: Colorectal                            cancer in father 23 or older Medicines:                Monitored Anesthesia Care Procedure:                Pre-Anesthesia Assessment:                           - Prior to the procedure, a History and Physical                            was performed, and patient medications and                            allergies were reviewed. The patient's tolerance of                            previous anesthesia was also reviewed. The risks                            and benefits of the procedure and the sedation                            options and risks were discussed with the patient.                            All questions were answered, and informed consent                            was obtained. Prior Anticoagulants: The patient has                            taken no previous anticoagulant or antiplatelet                            agents. ASA Grade Assessment: II - A patient with                            mild systemic disease. After reviewing the risks                            and benefits, the patient was deemed in                            satisfactory condition to undergo the procedure.  After obtaining informed consent, the colonoscope                            was passed under direct vision. Throughout the                            procedure, the patient's blood pressure, pulse, and                            oxygen saturations were monitored continuously. The                            Model PCF-[id number]) scope was introduced                            through the anus and advanced  to the 2 cm into the                            ileum. The colonoscopy was performed without                            difficulty. The patient tolerated the procedure                            well. The quality of the bowel preparation was good. Scope In: 8:41:22 AM Scope Out: 8:11:03 AM Scope Withdrawal Time: 0 hours 9 minutes 25 seconds  Total Procedure Duration: 0 hours 15 minutes 37 seconds  Findings:                 A 4 mm polyp was found in the cecum. The polyp was                            sessile. The polyp was removed with a cold snare.                            Resection and retrieval were complete. Estimated                            blood loss: none.                           Two sessile polyps were found in the mid descending                            colon and distal descending colon. The polyps were                            6 mm in size. These polyps were removed with a cold                            snare. Resection and retrieval were complete.  Estimated blood loss: none.                           A few small-mouthed diverticula were found in the                            sigmoid colon.                           Non-bleeding internal hemorrhoids were found during                            retroflexion. The hemorrhoids were small.                           The terminal ileum appeared normal.                           The exam was otherwise without abnormality on                            direct and retroflexion views. Complications:            No immediate complications. Estimated Blood Loss:     Estimated blood loss: none. Impression:               -Colon polyps status post polypectomy.                           -Mild sigmoid diverticulosis.                           -Otherwise normal colonoscopy to TI. Recommendation:           - Patient has a contact number available for                            emergencies. The signs and  symptoms of potential                            delayed complications were discussed with the                            patient. Return to normal activities tomorrow.                            Written discharge instructions were provided to the                            patient.                           - High-fiber diet.                           - Continue present medications.                           -  Brochures regarding diverticulosis.                           - Await pathology results.                           - Repeat colonoscopy for surveillance based on                            pathology results.                           - Return to GI clinic PRN. Jackquline Denmark, MD 07/26/2018 9:03:25 AM This report has been signed electronically.

## 2018-07-26 NOTE — Progress Notes (Signed)
PT taken to PACU. Monitors in place. VSS. Report given to RN. 

## 2018-07-26 NOTE — Progress Notes (Signed)
Called to room to assist during endoscopic procedure.  Patient ID and intended procedure confirmed with present staff. Received instructions for my participation in the procedure from the performing physician.  

## 2018-07-26 NOTE — Patient Instructions (Signed)
Information on polyps and diverticulosis given to you today.  Await pathology results.  High fiber diet.  YOU HAD AN ENDOSCOPIC PROCEDURE TODAY AT [facility]:   Refer to the procedure report that was given to you for any specific questions about what was found during the examination.  If the procedure report does not answer your questions, please call your gastroenterologist to clarify.  If you requested that your care partner not be given the details of your procedure findings, then the procedure report has been included in a sealed envelope for you to review at your convenience later.  YOU SHOULD EXPECT: Some feelings of bloating in the abdomen. Passage of more gas than usual.  Walking can help get rid of the air that was put into your GI tract during the procedure and reduce the bloating. If you had a lower endoscopy (such as a colonoscopy or flexible sigmoidoscopy) you may notice spotting of blood in your stool or on the toilet paper. If you underwent a bowel prep for your procedure, you may not have a normal bowel movement for a few days.  Please Note:  You might notice some irritation and congestion in your nose or some drainage.  This is from the oxygen used during your procedure.  There is no need for concern and it should clear up in a day or so.  SYMPTOMS TO REPORT IMMEDIATELY:   Following lower endoscopy (colonoscopy or flexible sigmoidoscopy):  Excessive amounts of blood in the stool  Significant tenderness or worsening of abdominal pains  Swelling of the abdomen that is new, acute  Fever of 100F or higher   For urgent or emergent issues, a gastroenterologist can be reached at any hour by calling [phone].   DIET:  We do recommend a small meal at first, but then you may proceed to your regular diet.  Drink plenty of fluids but you should avoid alcoholic beverages for 24 hours.  ACTIVITY:  You should plan to take it easy for the rest of today and you  should NOT DRIVE or use heavy machinery until tomorrow (because of the sedation medicines used during the test).    FOLLOW UP: Our staff will call the number listed on your records 48-72 hours following your procedure to check on you and address any questions or concerns that you may have regarding the information given to you following your procedure. If we do not reach you, we will leave a message.  We will attempt to reach you two times.  During this call, we will ask if you have developed any symptoms of COVID 19. If you develop any symptoms (ie: fever, flu-like symptoms, shortness of breath, cough etc.) before then, please call [phone].  If you test positive for Covid 19 in the 2 weeks post procedure, please call and report this information to [place].    If any biopsies were taken you will be contacted by phone or by letter within the next 1-3 weeks.  Please call us at [phone] if you have not heard about the biopsies in 3 weeks.    SIGNATURES/CONFIDENTIALITY: You and/or your care partner have signed paperwork which will be entered into your electronic medical record.  These signatures attest to the fact that that the information above on your After Visit Summary has been reviewed and is understood.  Full responsibility of the confidentiality of this discharge information lies with you and/or your care-partner.

## 2018-07-28 ENCOUNTER — Encounter: Payer: Self-pay | Admitting: Gastroenterology

## 2018-07-28 ENCOUNTER — Telehealth: Payer: Self-pay

## 2018-07-28 NOTE — Telephone Encounter (Signed)
  Follow up Call-  Call back number 07/26/2018  Post procedure Call Back phone  # [phone]  Permission to leave phone message Yes  Some recent data might be hidden     Patient questions:  Do you have a fever, pain , or abdominal swelling? No. Pain Score  0 *  Have you tolerated food without any problems? Yes.    Have you been able to return to your normal activities? Yes.    Do you have any questions about your discharge instructions: Diet   No. Medications  No. Follow up visit  No.  Do you have questions or concerns about your Care? No.  Actions: * If pain score is 4 or above: No action needed, pain <4.  1. Have you developed a fever since your procedure? no  2.   Have you had an respiratory symptoms (SOB or cough) since your procedure? no  3.   Have you tested positive for COVID 19 since your procedure no  4.   Have you had any family members/close contacts diagnosed with the COVID 19 since your procedure?  no   If yes to any of these questions please route to Joylene John, RN and Alphonsa Gin, Therapist, sports.

## 2018-08-09 DIAGNOSIS — R918 Other nonspecific abnormal finding of lung field: Secondary | ICD-10-CM | POA: Diagnosis not present

## 2018-08-24 DIAGNOSIS — H25813 Combined forms of age-related cataract, bilateral: Secondary | ICD-10-CM | POA: Diagnosis not present

## 2018-09-02 ENCOUNTER — Encounter: Payer: Self-pay | Admitting: Gastroenterology

## 2018-09-06 DIAGNOSIS — F5101 Primary insomnia: Secondary | ICD-10-CM | POA: Diagnosis not present

## 2018-09-06 DIAGNOSIS — R918 Other nonspecific abnormal finding of lung field: Secondary | ICD-10-CM | POA: Diagnosis not present

## 2018-09-06 DIAGNOSIS — N183 Chronic kidney disease, stage 3 (moderate): Secondary | ICD-10-CM | POA: Diagnosis not present

## 2018-09-06 DIAGNOSIS — D0510 Intraductal carcinoma in situ of unspecified breast: Secondary | ICD-10-CM | POA: Diagnosis not present

## 2018-09-06 DIAGNOSIS — M8949 Other hypertrophic osteoarthropathy, multiple sites: Secondary | ICD-10-CM | POA: Diagnosis not present

## 2018-09-06 DIAGNOSIS — D179 Benign lipomatous neoplasm, unspecified: Secondary | ICD-10-CM | POA: Diagnosis not present

## 2018-09-06 DIAGNOSIS — K7581 Nonalcoholic steatohepatitis (NASH): Secondary | ICD-10-CM | POA: Diagnosis not present

## 2018-09-06 DIAGNOSIS — N200 Calculus of kidney: Secondary | ICD-10-CM | POA: Diagnosis not present

## 2018-09-06 DIAGNOSIS — E781 Pure hyperglyceridemia: Secondary | ICD-10-CM | POA: Diagnosis not present

## 2018-09-06 DIAGNOSIS — E538 Deficiency of other specified B group vitamins: Secondary | ICD-10-CM | POA: Diagnosis not present

## 2018-09-06 DIAGNOSIS — Z0000 Encounter for general adult medical examination without abnormal findings: Secondary | ICD-10-CM | POA: Diagnosis not present

## 2018-09-06 DIAGNOSIS — I10 Essential (primary) hypertension: Secondary | ICD-10-CM | POA: Diagnosis not present

## 2018-09-06 DIAGNOSIS — Z79899 Other long term (current) drug therapy: Secondary | ICD-10-CM | POA: Diagnosis not present

## 2018-09-06 DIAGNOSIS — R7303 Prediabetes: Secondary | ICD-10-CM | POA: Diagnosis not present

## 2018-09-13 DIAGNOSIS — E538 Deficiency of other specified B group vitamins: Secondary | ICD-10-CM | POA: Diagnosis not present

## 2018-09-15 DIAGNOSIS — R220 Localized swelling, mass and lump, head: Secondary | ICD-10-CM | POA: Diagnosis not present

## 2018-09-15 DIAGNOSIS — D170 Benign lipomatous neoplasm of skin and subcutaneous tissue of head, face and neck: Secondary | ICD-10-CM | POA: Diagnosis not present

## 2018-10-14 DIAGNOSIS — E538 Deficiency of other specified B group vitamins: Secondary | ICD-10-CM | POA: Diagnosis not present

## 2018-11-04 DIAGNOSIS — I700 Atherosclerosis of aorta: Secondary | ICD-10-CM | POA: Diagnosis not present

## 2018-11-04 DIAGNOSIS — R911 Solitary pulmonary nodule: Secondary | ICD-10-CM | POA: Diagnosis not present

## 2018-11-04 DIAGNOSIS — R918 Other nonspecific abnormal finding of lung field: Secondary | ICD-10-CM | POA: Diagnosis not present

## 2018-11-23 DIAGNOSIS — R918 Other nonspecific abnormal finding of lung field: Secondary | ICD-10-CM | POA: Diagnosis not present

## 2018-12-14 DIAGNOSIS — E538 Deficiency of other specified B group vitamins: Secondary | ICD-10-CM | POA: Diagnosis not present

## 2018-12-16 DIAGNOSIS — C50919 Malignant neoplasm of unspecified site of unspecified female breast: Secondary | ICD-10-CM | POA: Diagnosis not present

## 2018-12-16 DIAGNOSIS — R918 Other nonspecific abnormal finding of lung field: Secondary | ICD-10-CM | POA: Diagnosis not present

## 2018-12-16 DIAGNOSIS — R7309 Other abnormal glucose: Secondary | ICD-10-CM | POA: Diagnosis not present

## 2018-12-28 DIAGNOSIS — R918 Other nonspecific abnormal finding of lung field: Secondary | ICD-10-CM | POA: Diagnosis not present

## 2018-12-28 DIAGNOSIS — Z01812 Encounter for preprocedural laboratory examination: Secondary | ICD-10-CM | POA: Diagnosis not present

## 2018-12-28 DIAGNOSIS — Z20828 Contact with and (suspected) exposure to other viral communicable diseases: Secondary | ICD-10-CM | POA: Diagnosis not present

## 2019-01-04 DIAGNOSIS — J939 Pneumothorax, unspecified: Secondary | ICD-10-CM | POA: Diagnosis not present

## 2019-01-04 DIAGNOSIS — C3432 Malignant neoplasm of lower lobe, left bronchus or lung: Secondary | ICD-10-CM | POA: Diagnosis not present

## 2019-01-04 DIAGNOSIS — R918 Other nonspecific abnormal finding of lung field: Secondary | ICD-10-CM | POA: Diagnosis not present

## 2019-01-04 DIAGNOSIS — Z853 Personal history of malignant neoplasm of breast: Secondary | ICD-10-CM | POA: Diagnosis not present

## 2019-01-13 DIAGNOSIS — R918 Other nonspecific abnormal finding of lung field: Secondary | ICD-10-CM | POA: Diagnosis not present

## 2019-01-13 DIAGNOSIS — Z882 Allergy status to sulfonamides status: Secondary | ICD-10-CM | POA: Diagnosis not present

## 2019-01-13 DIAGNOSIS — C3412 Malignant neoplasm of upper lobe, left bronchus or lung: Secondary | ICD-10-CM | POA: Diagnosis not present

## 2019-01-13 DIAGNOSIS — Z20828 Contact with and (suspected) exposure to other viral communicable diseases: Secondary | ICD-10-CM | POA: Diagnosis not present

## 2019-01-13 DIAGNOSIS — Z91040 Latex allergy status: Secondary | ICD-10-CM | POA: Diagnosis not present

## 2019-01-13 DIAGNOSIS — Z888 Allergy status to other drugs, medicaments and biological substances status: Secondary | ICD-10-CM | POA: Diagnosis not present

## 2019-01-18 DIAGNOSIS — Z01810 Encounter for preprocedural cardiovascular examination: Secondary | ICD-10-CM | POA: Diagnosis not present

## 2019-01-18 DIAGNOSIS — Z888 Allergy status to other drugs, medicaments and biological substances status: Secondary | ICD-10-CM | POA: Diagnosis not present

## 2019-01-18 DIAGNOSIS — Z4682 Encounter for fitting and adjustment of non-vascular catheter: Secondary | ICD-10-CM | POA: Diagnosis not present

## 2019-01-18 DIAGNOSIS — R911 Solitary pulmonary nodule: Secondary | ICD-10-CM | POA: Diagnosis not present

## 2019-01-18 DIAGNOSIS — C3432 Malignant neoplasm of lower lobe, left bronchus or lung: Secondary | ICD-10-CM | POA: Diagnosis not present

## 2019-01-18 DIAGNOSIS — J9811 Atelectasis: Secondary | ICD-10-CM | POA: Diagnosis not present

## 2019-01-18 DIAGNOSIS — R918 Other nonspecific abnormal finding of lung field: Secondary | ICD-10-CM | POA: Diagnosis not present

## 2019-01-18 DIAGNOSIS — G8918 Other acute postprocedural pain: Secondary | ICD-10-CM | POA: Diagnosis not present

## 2019-01-18 DIAGNOSIS — Z85850 Personal history of malignant neoplasm of thyroid: Secondary | ICD-10-CM | POA: Diagnosis not present

## 2019-01-18 DIAGNOSIS — Z91040 Latex allergy status: Secondary | ICD-10-CM | POA: Diagnosis not present

## 2019-01-18 DIAGNOSIS — J439 Emphysema, unspecified: Secondary | ICD-10-CM | POA: Diagnosis not present

## 2019-01-18 DIAGNOSIS — Z853 Personal history of malignant neoplasm of breast: Secondary | ICD-10-CM | POA: Diagnosis not present

## 2019-01-18 DIAGNOSIS — I129 Hypertensive chronic kidney disease with stage 1 through stage 4 chronic kidney disease, or unspecified chronic kidney disease: Secondary | ICD-10-CM | POA: Diagnosis not present

## 2019-01-18 DIAGNOSIS — N183 Chronic kidney disease, stage 3 unspecified: Secondary | ICD-10-CM | POA: Diagnosis not present

## 2019-01-21 DIAGNOSIS — R918 Other nonspecific abnormal finding of lung field: Secondary | ICD-10-CM | POA: Diagnosis not present

## 2019-02-10 DIAGNOSIS — Z483 Aftercare following surgery for neoplasm: Secondary | ICD-10-CM | POA: Diagnosis not present

## 2019-02-10 DIAGNOSIS — Z902 Acquired absence of lung [part of]: Secondary | ICD-10-CM | POA: Diagnosis not present

## 2019-02-10 DIAGNOSIS — C3492 Malignant neoplasm of unspecified part of left bronchus or lung: Secondary | ICD-10-CM | POA: Diagnosis not present

## 2019-02-17 DIAGNOSIS — Z85118 Personal history of other malignant neoplasm of bronchus and lung: Secondary | ICD-10-CM | POA: Diagnosis not present

## 2019-02-17 DIAGNOSIS — J40 Bronchitis, not specified as acute or chronic: Secondary | ICD-10-CM | POA: Diagnosis not present

## 2019-02-17 DIAGNOSIS — I10 Essential (primary) hypertension: Secondary | ICD-10-CM | POA: Diagnosis not present

## 2019-02-17 DIAGNOSIS — U071 COVID-19: Secondary | ICD-10-CM | POA: Diagnosis not present

## 2019-03-10 DIAGNOSIS — Z1231 Encounter for screening mammogram for malignant neoplasm of breast: Secondary | ICD-10-CM | POA: Diagnosis not present

## 2019-03-11 DIAGNOSIS — J310 Chronic rhinitis: Secondary | ICD-10-CM | POA: Diagnosis not present

## 2019-03-11 DIAGNOSIS — Z8616 Personal history of COVID-19: Secondary | ICD-10-CM | POA: Diagnosis not present

## 2019-03-11 DIAGNOSIS — N183 Chronic kidney disease, stage 3 unspecified: Secondary | ICD-10-CM | POA: Diagnosis not present

## 2019-03-11 DIAGNOSIS — K219 Gastro-esophageal reflux disease without esophagitis: Secondary | ICD-10-CM | POA: Diagnosis not present

## 2019-03-11 DIAGNOSIS — R7303 Prediabetes: Secondary | ICD-10-CM | POA: Diagnosis not present

## 2019-03-11 DIAGNOSIS — I129 Hypertensive chronic kidney disease with stage 1 through stage 4 chronic kidney disease, or unspecified chronic kidney disease: Secondary | ICD-10-CM | POA: Diagnosis not present

## 2019-03-11 DIAGNOSIS — K7581 Nonalcoholic steatohepatitis (NASH): Secondary | ICD-10-CM | POA: Diagnosis not present

## 2019-03-11 DIAGNOSIS — C3432 Malignant neoplasm of lower lobe, left bronchus or lung: Secondary | ICD-10-CM | POA: Diagnosis not present

## 2019-03-11 DIAGNOSIS — F5101 Primary insomnia: Secondary | ICD-10-CM | POA: Diagnosis not present

## 2019-03-14 DIAGNOSIS — C3490 Malignant neoplasm of unspecified part of unspecified bronchus or lung: Secondary | ICD-10-CM | POA: Diagnosis not present

## 2019-03-14 DIAGNOSIS — R918 Other nonspecific abnormal finding of lung field: Secondary | ICD-10-CM | POA: Diagnosis not present

## 2019-03-22 ENCOUNTER — Other Ambulatory Visit: Payer: Self-pay | Admitting: Internal Medicine

## 2019-03-22 DIAGNOSIS — R928 Other abnormal and inconclusive findings on diagnostic imaging of breast: Secondary | ICD-10-CM

## 2019-04-04 ENCOUNTER — Ambulatory Visit
Admission: RE | Admit: 2019-04-04 | Discharge: 2019-04-04 | Disposition: A | Discharge status: Home / Self Care | Payer: PPO | Source: Ambulatory Visit | Attending: Internal Medicine | Admitting: Internal Medicine

## 2019-04-04 ENCOUNTER — Other Ambulatory Visit: Payer: Self-pay

## 2019-04-04 ENCOUNTER — Other Ambulatory Visit: Payer: Self-pay | Admitting: Internal Medicine

## 2019-04-04 DIAGNOSIS — N6489 Other specified disorders of breast: Secondary | ICD-10-CM | POA: Diagnosis not present

## 2019-04-04 DIAGNOSIS — R928 Other abnormal and inconclusive findings on diagnostic imaging of breast: Secondary | ICD-10-CM

## 2019-04-06 DIAGNOSIS — E538 Deficiency of other specified B group vitamins: Secondary | ICD-10-CM | POA: Diagnosis not present

## 2019-04-07 ENCOUNTER — Other Ambulatory Visit: Payer: Self-pay

## 2019-04-07 ENCOUNTER — Other Ambulatory Visit: Payer: Self-pay | Admitting: Internal Medicine

## 2019-04-07 ENCOUNTER — Ambulatory Visit
Admission: RE | Admit: 2019-04-07 | Discharge: 2019-04-07 | Disposition: A | Discharge status: Home / Self Care | Payer: PPO | Source: Ambulatory Visit | Attending: Internal Medicine | Admitting: Internal Medicine

## 2019-04-07 DIAGNOSIS — R928 Other abnormal and inconclusive findings on diagnostic imaging of breast: Secondary | ICD-10-CM

## 2019-04-07 DIAGNOSIS — N6012 Diffuse cystic mastopathy of left breast: Secondary | ICD-10-CM | POA: Diagnosis not present

## 2019-04-07 DIAGNOSIS — N6321 Unspecified lump in the left breast, upper outer quadrant: Secondary | ICD-10-CM | POA: Diagnosis not present

## 2019-05-20 DIAGNOSIS — N390 Urinary tract infection, site not specified: Secondary | ICD-10-CM | POA: Diagnosis not present

## 2019-08-02 DIAGNOSIS — C3490 Malignant neoplasm of unspecified part of unspecified bronchus or lung: Secondary | ICD-10-CM | POA: Diagnosis not present

## 2019-08-02 DIAGNOSIS — Z902 Acquired absence of lung [part of]: Secondary | ICD-10-CM | POA: Diagnosis not present

## 2019-08-02 DIAGNOSIS — R918 Other nonspecific abnormal finding of lung field: Secondary | ICD-10-CM | POA: Diagnosis not present

## 2019-08-02 DIAGNOSIS — N200 Calculus of kidney: Secondary | ICD-10-CM | POA: Diagnosis not present

## 2019-08-02 DIAGNOSIS — Z9889 Other specified postprocedural states: Secondary | ICD-10-CM | POA: Diagnosis not present

## 2019-08-02 DIAGNOSIS — I700 Atherosclerosis of aorta: Secondary | ICD-10-CM | POA: Diagnosis not present

## 2019-08-04 DIAGNOSIS — Z08 Encounter for follow-up examination after completed treatment for malignant neoplasm: Secondary | ICD-10-CM | POA: Diagnosis not present

## 2019-08-04 DIAGNOSIS — C3432 Malignant neoplasm of lower lobe, left bronchus or lung: Secondary | ICD-10-CM | POA: Diagnosis not present

## 2019-08-04 DIAGNOSIS — Z85118 Personal history of other malignant neoplasm of bronchus and lung: Secondary | ICD-10-CM | POA: Diagnosis not present

## 2019-08-04 DIAGNOSIS — Z902 Acquired absence of lung [part of]: Secondary | ICD-10-CM | POA: Diagnosis not present

## 2019-09-05 DIAGNOSIS — H5213 Myopia, bilateral: Secondary | ICD-10-CM | POA: Diagnosis not present

## 2019-09-13 DIAGNOSIS — F419 Anxiety disorder, unspecified: Secondary | ICD-10-CM | POA: Diagnosis not present

## 2019-09-13 DIAGNOSIS — K7581 Nonalcoholic steatohepatitis (NASH): Secondary | ICD-10-CM | POA: Diagnosis not present

## 2019-09-13 DIAGNOSIS — R7303 Prediabetes: Secondary | ICD-10-CM | POA: Diagnosis not present

## 2019-09-13 DIAGNOSIS — E041 Nontoxic single thyroid nodule: Secondary | ICD-10-CM | POA: Diagnosis not present

## 2019-09-13 DIAGNOSIS — N1831 Chronic kidney disease, stage 3a: Secondary | ICD-10-CM | POA: Diagnosis not present

## 2019-09-13 DIAGNOSIS — I700 Atherosclerosis of aorta: Secondary | ICD-10-CM | POA: Diagnosis not present

## 2019-09-13 DIAGNOSIS — D0510 Intraductal carcinoma in situ of unspecified breast: Secondary | ICD-10-CM | POA: Diagnosis not present

## 2019-09-13 DIAGNOSIS — M8589 Other specified disorders of bone density and structure, multiple sites: Secondary | ICD-10-CM | POA: Diagnosis not present

## 2019-09-13 DIAGNOSIS — E538 Deficiency of other specified B group vitamins: Secondary | ICD-10-CM | POA: Diagnosis not present

## 2019-09-13 DIAGNOSIS — M8949 Other hypertrophic osteoarthropathy, multiple sites: Secondary | ICD-10-CM | POA: Diagnosis not present

## 2019-09-13 DIAGNOSIS — F5101 Primary insomnia: Secondary | ICD-10-CM | POA: Diagnosis not present

## 2019-09-13 DIAGNOSIS — Z0000 Encounter for general adult medical examination without abnormal findings: Secondary | ICD-10-CM | POA: Diagnosis not present

## 2019-09-13 DIAGNOSIS — K219 Gastro-esophageal reflux disease without esophagitis: Secondary | ICD-10-CM | POA: Diagnosis not present

## 2019-09-13 DIAGNOSIS — K5904 Chronic idiopathic constipation: Secondary | ICD-10-CM | POA: Diagnosis not present

## 2019-09-27 DIAGNOSIS — E041 Nontoxic single thyroid nodule: Secondary | ICD-10-CM | POA: Diagnosis not present

## 2019-09-30 DIAGNOSIS — Z85850 Personal history of malignant neoplasm of thyroid: Secondary | ICD-10-CM | POA: Diagnosis not present

## 2019-09-30 DIAGNOSIS — E041 Nontoxic single thyroid nodule: Secondary | ICD-10-CM | POA: Diagnosis not present

## 2019-10-19 DIAGNOSIS — M85852 Other specified disorders of bone density and structure, left thigh: Secondary | ICD-10-CM | POA: Diagnosis not present

## 2019-10-19 DIAGNOSIS — E538 Deficiency of other specified B group vitamins: Secondary | ICD-10-CM | POA: Diagnosis not present

## 2019-10-19 DIAGNOSIS — Z780 Asymptomatic menopausal state: Secondary | ICD-10-CM | POA: Diagnosis not present

## 2019-11-12 DIAGNOSIS — R1110 Vomiting, unspecified: Secondary | ICD-10-CM | POA: Diagnosis not present

## 2019-11-12 DIAGNOSIS — R319 Hematuria, unspecified: Secondary | ICD-10-CM | POA: Diagnosis not present

## 2019-11-12 DIAGNOSIS — N200 Calculus of kidney: Secondary | ICD-10-CM | POA: Diagnosis not present

## 2019-11-12 DIAGNOSIS — N3001 Acute cystitis with hematuria: Secondary | ICD-10-CM | POA: Diagnosis not present

## 2019-11-12 DIAGNOSIS — R1031 Right lower quadrant pain: Secondary | ICD-10-CM | POA: Diagnosis not present

## 2019-11-15 DIAGNOSIS — N200 Calculus of kidney: Secondary | ICD-10-CM | POA: Diagnosis not present

## 2019-11-15 DIAGNOSIS — N281 Cyst of kidney, acquired: Secondary | ICD-10-CM | POA: Diagnosis not present

## 2019-11-22 DIAGNOSIS — E538 Deficiency of other specified B group vitamins: Secondary | ICD-10-CM | POA: Diagnosis not present

## 2019-11-22 DIAGNOSIS — Z23 Encounter for immunization: Secondary | ICD-10-CM | POA: Diagnosis not present

## 2019-12-26 DIAGNOSIS — E538 Deficiency of other specified B group vitamins: Secondary | ICD-10-CM | POA: Diagnosis not present

## 2019-12-28 DIAGNOSIS — N200 Calculus of kidney: Secondary | ICD-10-CM | POA: Diagnosis not present

## 2019-12-28 DIAGNOSIS — N281 Cyst of kidney, acquired: Secondary | ICD-10-CM | POA: Diagnosis not present

## 2020-01-19 DIAGNOSIS — C3490 Malignant neoplasm of unspecified part of unspecified bronchus or lung: Secondary | ICD-10-CM | POA: Diagnosis not present

## 2020-01-19 DIAGNOSIS — R911 Solitary pulmonary nodule: Secondary | ICD-10-CM | POA: Diagnosis not present

## 2020-01-19 DIAGNOSIS — Z902 Acquired absence of lung [part of]: Secondary | ICD-10-CM | POA: Diagnosis not present

## 2020-01-19 DIAGNOSIS — C3432 Malignant neoplasm of lower lobe, left bronchus or lung: Secondary | ICD-10-CM | POA: Diagnosis not present

## 2020-01-25 DIAGNOSIS — E538 Deficiency of other specified B group vitamins: Secondary | ICD-10-CM | POA: Diagnosis not present

## 2020-02-16 DIAGNOSIS — Z85118 Personal history of other malignant neoplasm of bronchus and lung: Secondary | ICD-10-CM | POA: Diagnosis not present

## 2020-02-16 DIAGNOSIS — R918 Other nonspecific abnormal finding of lung field: Secondary | ICD-10-CM | POA: Diagnosis not present

## 2020-02-23 DIAGNOSIS — Z01818 Encounter for other preprocedural examination: Secondary | ICD-10-CM | POA: Diagnosis not present

## 2020-02-23 DIAGNOSIS — R918 Other nonspecific abnormal finding of lung field: Secondary | ICD-10-CM | POA: Diagnosis not present

## 2020-02-23 DIAGNOSIS — C3491 Malignant neoplasm of unspecified part of right bronchus or lung: Secondary | ICD-10-CM | POA: Diagnosis not present

## 2020-02-28 DIAGNOSIS — R918 Other nonspecific abnormal finding of lung field: Secondary | ICD-10-CM | POA: Diagnosis not present

## 2020-02-28 DIAGNOSIS — E538 Deficiency of other specified B group vitamins: Secondary | ICD-10-CM | POA: Diagnosis not present

## 2020-03-07 DIAGNOSIS — R7303 Prediabetes: Secondary | ICD-10-CM | POA: Diagnosis not present

## 2020-03-07 DIAGNOSIS — J939 Pneumothorax, unspecified: Secondary | ICD-10-CM | POA: Diagnosis not present

## 2020-03-07 DIAGNOSIS — Z9889 Other specified postprocedural states: Secondary | ICD-10-CM | POA: Diagnosis not present

## 2020-03-07 DIAGNOSIS — C3411 Malignant neoplasm of upper lobe, right bronchus or lung: Secondary | ICD-10-CM | POA: Diagnosis not present

## 2020-03-07 DIAGNOSIS — D0221 Carcinoma in situ of right bronchus and lung: Secondary | ICD-10-CM | POA: Diagnosis not present

## 2020-03-07 DIAGNOSIS — G8912 Acute post-thoracotomy pain: Secondary | ICD-10-CM | POA: Diagnosis not present

## 2020-03-07 DIAGNOSIS — J95811 Postprocedural pneumothorax: Secondary | ICD-10-CM | POA: Diagnosis not present

## 2020-03-07 DIAGNOSIS — J9811 Atelectasis: Secondary | ICD-10-CM | POA: Diagnosis not present

## 2020-03-07 DIAGNOSIS — R918 Other nonspecific abnormal finding of lung field: Secondary | ICD-10-CM | POA: Diagnosis not present

## 2020-03-19 DIAGNOSIS — K7581 Nonalcoholic steatohepatitis (NASH): Secondary | ICD-10-CM | POA: Diagnosis not present

## 2020-03-19 DIAGNOSIS — E538 Deficiency of other specified B group vitamins: Secondary | ICD-10-CM | POA: Diagnosis not present

## 2020-03-19 DIAGNOSIS — C3432 Malignant neoplasm of lower lobe, left bronchus or lung: Secondary | ICD-10-CM | POA: Diagnosis not present

## 2020-03-19 DIAGNOSIS — M8949 Other hypertrophic osteoarthropathy, multiple sites: Secondary | ICD-10-CM | POA: Diagnosis not present

## 2020-03-19 DIAGNOSIS — K219 Gastro-esophageal reflux disease without esophagitis: Secondary | ICD-10-CM | POA: Diagnosis not present

## 2020-03-19 DIAGNOSIS — N1831 Chronic kidney disease, stage 3a: Secondary | ICD-10-CM | POA: Diagnosis not present

## 2020-03-19 DIAGNOSIS — I10 Essential (primary) hypertension: Secondary | ICD-10-CM | POA: Diagnosis not present

## 2020-03-19 DIAGNOSIS — I129 Hypertensive chronic kidney disease with stage 1 through stage 4 chronic kidney disease, or unspecified chronic kidney disease: Secondary | ICD-10-CM | POA: Diagnosis not present

## 2020-03-19 DIAGNOSIS — C3411 Malignant neoplasm of upper lobe, right bronchus or lung: Secondary | ICD-10-CM | POA: Diagnosis not present

## 2020-03-19 DIAGNOSIS — R7303 Prediabetes: Secondary | ICD-10-CM | POA: Diagnosis not present

## 2020-03-19 DIAGNOSIS — F5101 Primary insomnia: Secondary | ICD-10-CM | POA: Diagnosis not present

## 2020-03-29 DIAGNOSIS — Z483 Aftercare following surgery for neoplasm: Secondary | ICD-10-CM | POA: Diagnosis not present

## 2020-03-30 DIAGNOSIS — E538 Deficiency of other specified B group vitamins: Secondary | ICD-10-CM | POA: Diagnosis not present

## 2020-04-27 DIAGNOSIS — E538 Deficiency of other specified B group vitamins: Secondary | ICD-10-CM | POA: Diagnosis not present

## 2020-05-28 DIAGNOSIS — E538 Deficiency of other specified B group vitamins: Secondary | ICD-10-CM | POA: Diagnosis not present

## 2020-06-28 DIAGNOSIS — E538 Deficiency of other specified B group vitamins: Secondary | ICD-10-CM | POA: Diagnosis not present

## 2020-07-19 DIAGNOSIS — J8410 Pulmonary fibrosis, unspecified: Secondary | ICD-10-CM | POA: Diagnosis not present

## 2020-07-19 DIAGNOSIS — M5134 Other intervertebral disc degeneration, thoracic region: Secondary | ICD-10-CM | POA: Diagnosis not present

## 2020-07-19 DIAGNOSIS — I700 Atherosclerosis of aorta: Secondary | ICD-10-CM | POA: Diagnosis not present

## 2020-07-19 DIAGNOSIS — Z08 Encounter for follow-up examination after completed treatment for malignant neoplasm: Secondary | ICD-10-CM | POA: Diagnosis not present

## 2020-07-19 DIAGNOSIS — Z87891 Personal history of nicotine dependence: Secondary | ICD-10-CM | POA: Diagnosis not present

## 2020-07-19 DIAGNOSIS — C3490 Malignant neoplasm of unspecified part of unspecified bronchus or lung: Secondary | ICD-10-CM | POA: Diagnosis not present

## 2020-07-19 DIAGNOSIS — Z902 Acquired absence of lung [part of]: Secondary | ICD-10-CM | POA: Diagnosis not present

## 2020-07-19 DIAGNOSIS — Z85118 Personal history of other malignant neoplasm of bronchus and lung: Secondary | ICD-10-CM | POA: Diagnosis not present

## 2020-07-19 DIAGNOSIS — K760 Fatty (change of) liver, not elsewhere classified: Secondary | ICD-10-CM | POA: Diagnosis not present

## 2020-07-30 DIAGNOSIS — E538 Deficiency of other specified B group vitamins: Secondary | ICD-10-CM | POA: Diagnosis not present

## 2020-08-15 DIAGNOSIS — K5792 Diverticulitis of intestine, part unspecified, without perforation or abscess without bleeding: Secondary | ICD-10-CM | POA: Diagnosis not present

## 2020-08-15 DIAGNOSIS — C3411 Malignant neoplasm of upper lobe, right bronchus or lung: Secondary | ICD-10-CM | POA: Diagnosis not present

## 2020-08-15 DIAGNOSIS — K7581 Nonalcoholic steatohepatitis (NASH): Secondary | ICD-10-CM | POA: Diagnosis not present

## 2020-08-15 DIAGNOSIS — R1032 Left lower quadrant pain: Secondary | ICD-10-CM | POA: Diagnosis not present

## 2020-08-15 DIAGNOSIS — C3432 Malignant neoplasm of lower lobe, left bronchus or lung: Secondary | ICD-10-CM | POA: Diagnosis not present

## 2020-08-29 DIAGNOSIS — E538 Deficiency of other specified B group vitamins: Secondary | ICD-10-CM | POA: Diagnosis not present

## 2020-10-01 DIAGNOSIS — E538 Deficiency of other specified B group vitamins: Secondary | ICD-10-CM | POA: Diagnosis not present

## 2020-10-01 DIAGNOSIS — H25813 Combined forms of age-related cataract, bilateral: Secondary | ICD-10-CM | POA: Diagnosis not present

## 2020-10-08 DIAGNOSIS — R319 Hematuria, unspecified: Secondary | ICD-10-CM | POA: Diagnosis not present

## 2020-10-08 DIAGNOSIS — R031 Nonspecific low blood-pressure reading: Secondary | ICD-10-CM | POA: Diagnosis not present

## 2020-10-08 DIAGNOSIS — N3001 Acute cystitis with hematuria: Secondary | ICD-10-CM | POA: Diagnosis not present

## 2020-10-11 DIAGNOSIS — N201 Calculus of ureter: Secondary | ICD-10-CM | POA: Diagnosis not present

## 2020-10-11 DIAGNOSIS — Z853 Personal history of malignant neoplasm of breast: Secondary | ICD-10-CM | POA: Diagnosis not present

## 2020-10-11 DIAGNOSIS — R310 Gross hematuria: Secondary | ICD-10-CM | POA: Diagnosis not present

## 2020-10-11 DIAGNOSIS — K5730 Diverticulosis of large intestine without perforation or abscess without bleeding: Secondary | ICD-10-CM | POA: Diagnosis not present

## 2020-10-11 DIAGNOSIS — N132 Hydronephrosis with renal and ureteral calculous obstruction: Secondary | ICD-10-CM | POA: Diagnosis not present

## 2020-10-11 DIAGNOSIS — K429 Umbilical hernia without obstruction or gangrene: Secondary | ICD-10-CM | POA: Diagnosis not present

## 2020-10-12 ENCOUNTER — Other Ambulatory Visit: Payer: Self-pay

## 2020-10-12 ENCOUNTER — Other Ambulatory Visit: Payer: Self-pay | Admitting: Urology

## 2020-10-12 ENCOUNTER — Encounter (HOSPITAL_COMMUNITY): Payer: Self-pay | Admitting: Urology

## 2020-10-12 NOTE — Progress Notes (Addendum)
COVID swab appointment:  N/A  COVID Vaccine Completed:  Yes x3 Date COVID Vaccine completed: Has received booster:  Yes x1 COVID vaccine manufacturer:   Moderna    Date of COVID positive in last 90 days:  No  PCP - Letha Cape, MD Cardiologist - 20+ years ago to evaluate palpitations  Chest x-ray - 03-09-20 Care Everywhere EKG - 02-23-20 Care Everywhere.  Requested x2 from [facility] (did not receive by end of day) Stress Test - 01-18-19 Epic ECHO - with stress test in 2020 Cardiac Cath - N/A Pacemaker/ICD device last checked: Spinal Cord Stimulator:  Sleep Study -  N/A CPAP -   Fasting Blood Sugar - N/A Checks Blood Sugar _____ times a day  Blood Thinner Instructions:  N/A Aspirin Instructions: Last Dose:  Activity level:  Can go up a flight of stairs and perform activities of daily living without stopping and without symptoms of chest pain or shortness of breath.  Able to exercise without symptoms.  Patient has had bilateral lung cancer but denies shortness of breath.    Anesthesia review:  N/A  Patient denies shortness of breath, fever, cough and chest pain at PAT appointment (completed over the phone)   Patient verbalized understanding of instructions that were given to them at the PAT appointment. Patient was also instructed that they will need to review over the PAT instructions again at home before surgery.

## 2020-10-15 NOTE — H&P (Signed)
Office Visit Report     10/11/2020   --------------------------------------------------------------------------------   Renee Wilkerson. Malkin  MRN: [id number]  DOB: Jun 09, 1945, 75 year old Female  SSN: -[id number]   PRIMARY CARE:  Gilford Rile, MD  REFERRING:  Georgette Dover, MD  PROVIDER:  Festus Aloe, M.D.  LOCATION:  [facility] [phone]     --------------------------------------------------------------------------------   CC/HPI: F/u -   1) kidney stones - She has a history of 80% uric acid, 20% calcium oxalate stones. Her last 24 hour UA showed remarkedly low total urine volume <1 L/day. She has been unable to tolerate potassium citrate therapy due to GI complaints.   She was treated for a right UPJ stone March 2018 which she ultimately passed. There were punctate bilateral stones remaining. In 2019, treated with left ESWL for a 8 mm left prox stone. CT in 2020 with bilateral 4 mm stones, parapelvic cysts. [place] 11/21l [place] with central cysts and small stones. No obvious hydro. KUB clear.   She had the left lower love of her lung removed. She did not need XRT or chemo. Her husband passed in 2020.   CT 09/22 with 6-8 mm left mid ureteral stone and prox left hydro.    2) gross hematuria - noted red urine a few days ago Aug 2022. Started abx and tamsulosin. Also had left flank pain. H/o stones. No tamsulosin. No stone passage. UA clear today. CT 09/22 benign - confirms a left mid ureteral stone.    Today, seen for the above.     ALLERGIES: Latex Sulfur     MEDICATIONS: Cipro  Flomax 0.4 mg capsule  Losartan Potassium 50 mg tablet Oral  Omeprazole 40 mg capsule,delayed release PRN  Tramadol Hcl 50 mg tablet 1 tablet PO Q 6 H PRN  Vitamin B12  Xyzal 5 mg tablet  Zofran 4 mg tablet tablet PRN  Zolpidem Tartrate 10 mg tablet Oral     GU PSH: ESWL - 2019 Hysterectomy Unilat SO - 2015 Locm 300-$RemoveBefor'399Mg'XxYQFhtaxxhr$ /Ml Iodine,1Ml - 2020       PSH Notes: Breast  Surgery, Hysterectomy;   Removal of cancerous spot from lung   NON-GU PSH: Breast Surgery Procedure - 2015 Lung lobectomy     GU PMH: Renal calculus, Stable. Check [place] and KUB yearly. - 12/28/2019, - 11/15/2019, - 2020, - 2020, - 2019, - 2019, - 2019, - 2019, - 2019, - 2018, - 2017, Nephrolithiasis, - 2016 Renal cyst, Stable. - 12/28/2019, - 11/15/2019, Renal cysts, acquired, bilateral, - 2016 Gross hematuria - 2020 LLQ pain - 2019 Flank Pain - 2019    NON-GU PMH: Encounter for general adult medical examination without abnormal findings, Encounter for preventive health examination - 2015 Breast Cancer, History, History of breast cancer - 2015 Personal history of other diseases of the digestive system, History of esophageal reflux - 2015 Personal history of other mental and behavioral disorders, History of depression - 2015 Lung Cancer, History    FAMILY HISTORY: Benign hematuria - Runs In Family Death - Father   SOCIAL HISTORY: Marital Status: Married Preferred Language: English; Ethnicity: Not Hispanic Or Latino; Race: White Current Smoking Status: Patient has never smoked.  Has never drank.  Drinks 1 caffeinated drink per day. Patient's occupation is/was Semi-retired.    REVIEW OF SYSTEMS:    GU Review Female:   Patient denies frequent urination, hard to postpone urination, burning /pain with urination, get up at night to urinate, leakage of urine, stream starts and stops, trouble  starting your stream, have to strain to urinate, and being pregnant.  Gastrointestinal (Upper):   Patient reports nausea and vomiting. Patient denies indigestion/ heartburn.  Gastrointestinal (Lower):   Patient denies diarrhea and constipation.  Constitutional:   Patient denies fever, night sweats, weight loss, and fatigue.  Skin:   Patient denies skin rash/ lesion and itching.  Eyes:   Patient denies double vision and blurred vision.  Ears/ Nose/ Throat:   Patient denies sore throat and sinus  problems.  Hematologic/Lymphatic:   Patient denies swollen glands and easy bruising.  Cardiovascular:   Patient denies leg swelling and chest pains.  Respiratory:   Patient denies cough and shortness of breath.  Endocrine:   Patient denies excessive thirst.  Musculoskeletal:   Patient reports back pain. Patient denies joint pain.  Neurological:   Patient denies headaches and dizziness.  Psychologic:   Patient denies depression and anxiety.   VITAL SIGNS:      10/11/2020 10:16 AM  BP 149/94 mmHg  Pulse 76 /min  Temperature 97.1 F / 36.1 C   MULTI-SYSTEM PHYSICAL EXAMINATION:    Constitutional: Well-nourished. No physical deformities. Normally developed. Good grooming.  Neck: Neck symmetrical, not swollen. Normal tracheal position.  Respiratory: No labored breathing, no use of accessory muscles.   Cardiovascular: Normal temperature, normal extremity pulses, no swelling, no varicosities.  Neurologic / Psychiatric: Oriented to time, oriented to place, oriented to person. No depression, no anxiety, no agitation.  Gastrointestinal: No mass, no tenderness, no rigidity, non obese abdomen.     Complexity of Data:  Records Review:   Previous Patient Records  X-Ray Review: C.T. Abdomen/Pelvis: Reviewed Films. Discussed With Patient. today     PROCEDURES:         C.T. ABD-Pelv w/o - 95638      Patient confirmed No Neulasta OnPro Device.         Urinalysis w/Scope Dipstick Dipstick Cont'd Micro  Color: Yellow Bilirubin: Neg mg/dL WBC/hpf: 0 - 5/hpf  Appearance: Slightly Cloudy Ketones: Neg mg/dL RBC/hpf: 0 - 2/hpf  Specific Gravity: 1.030 Blood: Trace ery/uL Bacteria: Few (10-25/hpf)  pH: <=5.0 Protein: Neg mg/dL Cystals: NS (Not Seen)  Glucose: Neg mg/dL Urobilinogen: 1.0 mg/dL Casts: NS (Not Seen)    Nitrites: Neg Trichomonas: Not Present    Leukocyte Esterase: 2+ leu/uL Mucous: Not Present      Epithelial Cells: 0 - 5/hpf      Yeast: NS (Not Seen)      Sperm: Not Present     Notes: Microscopic performed on unspun sample due to QNS.          Ketoralac $RemoveBe'60mg'KULHFRUce$  - [id number], [id number] Qty: 60 Adm. By: Anell Barr  Unit: mg Lot No [id number]  Route: IM Exp. Date 09/09/2021  Freq: None Mfgr.:   Site: Left Buttock   ASSESSMENT:      ICD-10 Details  1 GU:   Gross hematuria - R31.0 Chronic, Stable - CT benign though official read pending   2   Ureteral calculus - N20.1 Chronic, Stable - She was given ketrolac 60 mg IM today. Disc the left ureteral stone and nature r/b of MET/passage, URS and ESWL. She will proceed with URS and we discussed possible need for prestent.    PLAN:            Medications New Meds: Tamsulosin Hcl 0.4 mg capsule 1 capsule PO Q HS   #30  0 Refill(s)            Orders Labs  Urine Culture  X-Rays: C.T. Abdomen/Pelvis Without I.V. Contrast  X-Ray Notes: History:  Hematuria: Yes/No  Patient to see MD after exam: Yes/No  Previous exam: CT / IVP/ US/ KUB/ None  When:  Where:  Diabetic: Yes/ No  BUN/ Creatine:  Date of last BUN Creatinine:  Weight in pounds:  Allergy- Contrasts/ Shellfish: Yes/ No  Conflicting diabetic meds: Yes/ No  Oral contrast and instructions given to patient:   Prior Authorization #: [id number]            Schedule Return Visit/Planned Activity: Next Available Appointment - Schedule Surgery          Document Letter(s):  Created for Patient: Clinical Summary         Notes:   cc: Dr. Bea Graff     * Signed by Festus Aloe, M.D. on 10/11/20 at 5:59 PM (EDT)*      The information contained in this medical record document is considered private and confidential patient information. This information can only be used for the medical diagnosis and/or medical services that are being provided by the patient's selected caregivers. This information can only be distributed outside of the patient's care if the patient agrees and signs waivers of authorization for this information to be sent to an outside source  or route.

## 2020-10-16 ENCOUNTER — Ambulatory Visit (HOSPITAL_COMMUNITY): Payer: PPO | Admitting: Certified Registered Nurse Anesthetist

## 2020-10-16 ENCOUNTER — Encounter (HOSPITAL_COMMUNITY)
Admission: RE | Disposition: A | Discharge status: Home / Self Care | Payer: Self-pay | Source: Home / Self Care | Attending: Urology

## 2020-10-16 ENCOUNTER — Ambulatory Visit (HOSPITAL_COMMUNITY): Payer: PPO

## 2020-10-16 ENCOUNTER — Ambulatory Visit (HOSPITAL_COMMUNITY)
Admission: RE | Admit: 2020-10-16 | Discharge: 2020-10-16 | Disposition: A | Discharge status: Home / Self Care | Payer: PPO | Attending: Urology | Admitting: Urology

## 2020-10-16 ENCOUNTER — Encounter (HOSPITAL_COMMUNITY): Payer: Self-pay | Admitting: Urology

## 2020-10-16 DIAGNOSIS — Z634 Disappearance and death of family member: Secondary | ICD-10-CM | POA: Diagnosis not present

## 2020-10-16 DIAGNOSIS — Z90710 Acquired absence of both cervix and uterus: Secondary | ICD-10-CM | POA: Diagnosis not present

## 2020-10-16 DIAGNOSIS — Z91040 Latex allergy status: Secondary | ICD-10-CM | POA: Diagnosis not present

## 2020-10-16 DIAGNOSIS — Z882 Allergy status to sulfonamides status: Secondary | ICD-10-CM | POA: Insufficient documentation

## 2020-10-16 DIAGNOSIS — Z538 Procedure and treatment not carried out for other reasons: Secondary | ICD-10-CM | POA: Insufficient documentation

## 2020-10-16 DIAGNOSIS — R310 Gross hematuria: Secondary | ICD-10-CM | POA: Diagnosis not present

## 2020-10-16 DIAGNOSIS — N201 Calculus of ureter: Secondary | ICD-10-CM

## 2020-10-16 DIAGNOSIS — Z87442 Personal history of urinary calculi: Secondary | ICD-10-CM | POA: Diagnosis not present

## 2020-10-16 DIAGNOSIS — Z853 Personal history of malignant neoplasm of breast: Secondary | ICD-10-CM | POA: Insufficient documentation

## 2020-10-16 DIAGNOSIS — Z902 Acquired absence of lung [part of]: Secondary | ICD-10-CM | POA: Diagnosis not present

## 2020-10-16 DIAGNOSIS — Z85118 Personal history of other malignant neoplasm of bronchus and lung: Secondary | ICD-10-CM | POA: Diagnosis not present

## 2020-10-16 DIAGNOSIS — Z01818 Encounter for other preprocedural examination: Secondary | ICD-10-CM | POA: Diagnosis not present

## 2020-10-16 DIAGNOSIS — Z842 Family history of other diseases of the genitourinary system: Secondary | ICD-10-CM | POA: Insufficient documentation

## 2020-10-16 HISTORY — DX: Prediabetes: R73.03

## 2020-10-16 HISTORY — DX: Constipation, unspecified: K59.00

## 2020-10-16 HISTORY — DX: Diverticulitis of intestine, part unspecified, without perforation or abscess without bleeding: K57.92

## 2020-10-16 HISTORY — DX: Anxiety disorder, unspecified: F41.9

## 2020-10-16 HISTORY — DX: Malignant neoplasm of unspecified part of unspecified bronchus or lung: C34.90

## 2020-10-16 HISTORY — DX: Nonalcoholic steatohepatitis (NASH): K75.81

## 2020-10-16 HISTORY — DX: Personal history of urinary calculi: Z87.442

## 2020-10-16 LAB — COMPREHENSIVE METABOLIC PANEL
ALT: 36 U/L (ref 0–44)
AST: 25 U/L (ref 15–41)
Albumin: 3.6 g/dL (ref 3.5–5.0)
Alkaline Phosphatase: 101 U/L (ref 38–126)
Anion gap: 9 (ref 5–15)
BUN: 19 mg/dL (ref 8–23)
CO2: 24 mmol/L (ref 22–32)
Calcium: 9.1 mg/dL (ref 8.9–10.3)
Chloride: 108 mmol/L (ref 98–111)
Creatinine, Ser: 0.97 mg/dL (ref 0.44–1.00)
GFR, Estimated: >60 mL/min (ref >60)
Glucose, Bld: 119 mg/dL — ABNORMAL HIGH (ref 70–99)
Potassium: 4.7 mmol/L (ref 3.5–5.1)
Sodium: 141 mmol/L (ref 135–145)
Total Bilirubin: 0.6 mg/dL (ref 0.3–1.2)
Total Protein: 6.4 g/dL — ABNORMAL LOW (ref 6.5–8.1)

## 2020-10-16 LAB — GLUCOSE, CAPILLARY: Glucose-Capillary: 104 mg/dL — ABNORMAL HIGH (ref 70–99)

## 2020-10-16 LAB — CBC
HCT: 41.9 % (ref 36.0–46.0)
Hemoglobin: 13.7 g/dL (ref 12.0–15.0)
MCH: 32.3 pg (ref 26.0–34.0)
MCHC: 32.7 g/dL (ref 30.0–36.0)
MCV: 98.8 fL (ref 80.0–100.0)
Platelets: 199 10*3/uL (ref 150–400)
RBC: 4.24 MIL/uL (ref 3.87–5.11)
RDW: 13 % (ref 11.5–15.5)
WBC: 6.5 10*3/uL (ref 4.0–10.5)
nRBC: 0 % (ref 0.0–0.2)

## 2020-10-16 LAB — HEMOGLOBIN A1C
Hgb A1c MFr Bld: 6.6 % — ABNORMAL HIGH (ref 4.8–5.6)
Mean Plasma Glucose: 142.72 mg/dL

## 2020-10-16 SURGERY — CYSTOSCOPY/URETEROSCOPY/HOLMIUM LASER/STENT PLACEMENT
Anesthesia: General | Laterality: Left

## 2020-10-16 MED ORDER — ORAL CARE MOUTH RINSE: 15.0000 mL | Freq: Once | OROMUCOSAL | Status: AC

## 2020-10-16 MED ORDER — CEFAZOLIN SODIUM-DEXTROSE 2-4 GM/100ML-% IV SOLN
2.0000 g | INTRAVENOUS | Status: DC
  Filled 2020-10-16: qty 100

## 2020-10-16 MED ORDER — LACTATED RINGERS IV SOLN: INTRAVENOUS | Status: DC

## 2020-10-16 MED ORDER — CHLORHEXIDINE GLUCONATE 0.12 % MT SOLN
15.0000 mL | Freq: Once | OROMUCOSAL | Status: AC
  Administered 2020-10-16: 15 mL via OROMUCOSAL

## 2020-10-16 NOTE — Interval H&P Note (Signed)
History and Physical Interval Note:  10/16/2020 1:58 PM  Renee Wilkerson  has presented today for surgery, with the diagnosis of LEFT URETERAL STONE.  The various methods of treatment have been discussed with the patient and family. After consideration of risks, benefits and other options for treatment, the patient has consented to  Procedure(s) with comments: CYSTOSCOPY/RETROGRADE/URETEROSCOPY/HOLMIUM LASER/STENT PLACEMENT (Left) - ONLY NEEDS 60 MIN as a surgical intervention.  The patient's history has been reviewed, patient examined. She passed a sizeable stone on Sunday and then two smaller ones. She brought for me to see. I examined in a paper towel a large is at least 6 mm stone and then two 2-3 mm stones. She still reports some left flank pain but not severe. KUB unofficially appears negative for stone and I thought the stone was visible on the CT scout. We discussed she could still be passing a small stone but from what I can see it looks like she passed the 6-8 mm one. We discussed proceeding with cysto, left RGP, left URS today or f/u in office for renal US. She will f/u for renal US. Procedure today CANCELED. Questions were answered to the patient's satisfaction.     Festus Aloe

## 2020-10-16 NOTE — Anesthesia Preprocedure Evaluation (Addendum)
Anesthesia Evaluation  Patient identified by MRN, date of birth, ID band Patient awake    Reviewed: Allergy & Precautions, NPO status , Patient's Chart, lab work & pertinent test results  History of Anesthesia Complications Negative for: history of anesthetic complications  Airway Mallampati: II  TM Distance: >3 FB Neck ROM: Full    Dental  (+) Edentulous Upper, Edentulous Lower   Pulmonary  Lung cancer s/p wedge resection   Pulmonary exam normal        Cardiovascular hypertension, Normal cardiovascular exam     Neuro/Psych negative neurological ROS     GI/Hepatic GERD  ,(+) Hepatitis - ([facility])  Endo/Other  negative endocrine ROS  Renal/GU CRFRenal disease (Left ureteral stone)  negative genitourinary   Musculoskeletal negative musculoskeletal ROS (+)   Abdominal   Peds  Hematology negative hematology ROS (+)   Anesthesia Other Findings   Reproductive/Obstetrics                            Anesthesia Physical Anesthesia Plan  ASA: 3  Anesthesia Plan: General   Post-op Pain Management:    Induction: Intravenous  PONV Risk Score and Plan: 3 and Ondansetron, Dexamethasone, Midazolam and Treatment may vary due to age or medical condition  Airway Management Planned: LMA  Additional Equipment: None  Intra-op Plan:   Post-operative Plan: Extubation in OR  Informed Consent: I have reviewed the patients History and Physical, chart, labs and discussed the procedure including the risks, benefits and alternatives for the proposed anesthesia with the patient or authorized representative who has indicated his/her understanding and acceptance.     Dental advisory given  Plan Discussed with:   Anesthesia Plan Comments: (Cancelled: Patient passed stone. )      Anesthesia Quick Evaluation

## 2020-10-31 DIAGNOSIS — E538 Deficiency of other specified B group vitamins: Secondary | ICD-10-CM | POA: Diagnosis not present

## 2020-11-08 DIAGNOSIS — N202 Calculus of kidney with calculus of ureter: Secondary | ICD-10-CM | POA: Diagnosis not present

## 2020-11-08 DIAGNOSIS — N281 Cyst of kidney, acquired: Secondary | ICD-10-CM | POA: Diagnosis not present

## 2020-11-14 DIAGNOSIS — R109 Unspecified abdominal pain: Secondary | ICD-10-CM | POA: Diagnosis not present

## 2020-11-14 DIAGNOSIS — K760 Fatty (change of) liver, not elsewhere classified: Secondary | ICD-10-CM | POA: Diagnosis not present

## 2020-11-14 DIAGNOSIS — K5792 Diverticulitis of intestine, part unspecified, without perforation or abscess without bleeding: Secondary | ICD-10-CM | POA: Diagnosis not present

## 2020-11-14 DIAGNOSIS — R1032 Left lower quadrant pain: Secondary | ICD-10-CM | POA: Diagnosis not present

## 2020-12-03 DIAGNOSIS — E538 Deficiency of other specified B group vitamins: Secondary | ICD-10-CM | POA: Diagnosis not present

## 2020-12-12 DIAGNOSIS — Z23 Encounter for immunization: Secondary | ICD-10-CM | POA: Diagnosis not present

## 2021-01-07 DIAGNOSIS — E538 Deficiency of other specified B group vitamins: Secondary | ICD-10-CM | POA: Diagnosis not present

## 2021-01-08 DIAGNOSIS — J209 Acute bronchitis, unspecified: Secondary | ICD-10-CM | POA: Diagnosis not present

## 2021-01-08 DIAGNOSIS — J0100 Acute maxillary sinusitis, unspecified: Secondary | ICD-10-CM | POA: Diagnosis not present

## 2021-01-24 DIAGNOSIS — Z85118 Personal history of other malignant neoplasm of bronchus and lung: Secondary | ICD-10-CM | POA: Diagnosis not present

## 2021-01-24 DIAGNOSIS — R911 Solitary pulmonary nodule: Secondary | ICD-10-CM | POA: Diagnosis not present

## 2021-01-24 DIAGNOSIS — Z902 Acquired absence of lung [part of]: Secondary | ICD-10-CM | POA: Diagnosis not present

## 2021-01-24 DIAGNOSIS — I700 Atherosclerosis of aorta: Secondary | ICD-10-CM | POA: Diagnosis not present

## 2021-01-24 DIAGNOSIS — C3490 Malignant neoplasm of unspecified part of unspecified bronchus or lung: Secondary | ICD-10-CM | POA: Diagnosis not present

## 2021-01-24 DIAGNOSIS — Z08 Encounter for follow-up examination after completed treatment for malignant neoplasm: Secondary | ICD-10-CM | POA: Diagnosis not present

## 2021-01-31 DIAGNOSIS — R911 Solitary pulmonary nodule: Secondary | ICD-10-CM | POA: Diagnosis not present

## 2021-01-31 DIAGNOSIS — Z902 Acquired absence of lung [part of]: Secondary | ICD-10-CM | POA: Diagnosis not present

## 2021-01-31 DIAGNOSIS — Z85118 Personal history of other malignant neoplasm of bronchus and lung: Secondary | ICD-10-CM | POA: Diagnosis not present

## 2021-07-11 DEATH — deceased
